# Patient Record
Sex: Female | Born: 1980 | Hispanic: Yes | Marital: Married | Smoking: Never smoker
Health system: Southern US, Community
[De-identification: ages and names within clinical notes are randomized; demographics above are authoritative.]

## PROBLEM LIST (undated history)

## (undated) DIAGNOSIS — O24419 Gestational diabetes mellitus in pregnancy, unspecified control: Secondary | ICD-10-CM

## (undated) DIAGNOSIS — D649 Anemia, unspecified: Secondary | ICD-10-CM

## (undated) HISTORY — PX: NO PAST SURGERIES: SHX2092

---

## 2007-10-09 ENCOUNTER — Ambulatory Visit (HOSPITAL_COMMUNITY): Admission: RE | Admit: 2007-10-09 | Discharge: 2007-10-09 | Payer: Self-pay | Admitting: Family Medicine

## 2007-10-19 ENCOUNTER — Ambulatory Visit: Payer: Self-pay | Admitting: Family Medicine

## 2007-10-19 ENCOUNTER — Encounter: Admission: RE | Admit: 2007-10-19 | Discharge: 2007-12-22 | Payer: Self-pay | Admitting: Family Medicine

## 2007-10-26 ENCOUNTER — Ambulatory Visit: Payer: Self-pay | Admitting: *Deleted

## 2007-10-28 ENCOUNTER — Ambulatory Visit: Payer: Self-pay | Admitting: Family Medicine

## 2007-11-02 ENCOUNTER — Ambulatory Visit: Payer: Self-pay | Admitting: Obstetrics & Gynecology

## 2007-11-09 ENCOUNTER — Ambulatory Visit: Payer: Self-pay | Admitting: Obstetrics & Gynecology

## 2007-11-16 ENCOUNTER — Ambulatory Visit: Payer: Self-pay | Admitting: Obstetrics & Gynecology

## 2007-11-16 ENCOUNTER — Ambulatory Visit (HOSPITAL_COMMUNITY): Admission: RE | Admit: 2007-11-16 | Discharge: 2007-11-16 | Payer: Self-pay | Admitting: Family Medicine

## 2007-11-23 ENCOUNTER — Ambulatory Visit: Payer: Self-pay | Admitting: Obstetrics & Gynecology

## 2007-12-14 ENCOUNTER — Ambulatory Visit: Payer: Self-pay | Admitting: Obstetrics & Gynecology

## 2007-12-21 ENCOUNTER — Ambulatory Visit: Payer: Self-pay | Admitting: Obstetrics & Gynecology

## 2007-12-28 ENCOUNTER — Ambulatory Visit: Payer: Self-pay | Admitting: Obstetrics & Gynecology

## 2007-12-28 ENCOUNTER — Ambulatory Visit (HOSPITAL_COMMUNITY): Admission: RE | Admit: 2007-12-28 | Discharge: 2007-12-28 | Payer: Self-pay | Admitting: Gynecology

## 2007-12-31 ENCOUNTER — Ambulatory Visit: Payer: Self-pay | Admitting: Obstetrics & Gynecology

## 2008-01-04 ENCOUNTER — Ambulatory Visit: Payer: Self-pay | Admitting: Obstetrics & Gynecology

## 2008-01-07 ENCOUNTER — Ambulatory Visit: Payer: Self-pay | Admitting: Obstetrics & Gynecology

## 2008-01-11 ENCOUNTER — Ambulatory Visit: Payer: Self-pay | Admitting: Family Medicine

## 2008-01-14 ENCOUNTER — Ambulatory Visit: Payer: Self-pay | Admitting: Obstetrics & Gynecology

## 2008-01-18 ENCOUNTER — Encounter: Admission: RE | Admit: 2008-01-18 | Discharge: 2008-01-25 | Payer: Self-pay | Admitting: Family Medicine

## 2008-01-18 ENCOUNTER — Ambulatory Visit (HOSPITAL_COMMUNITY): Admission: RE | Admit: 2008-01-18 | Discharge: 2008-01-18 | Payer: Self-pay | Admitting: Family Medicine

## 2008-01-18 ENCOUNTER — Ambulatory Visit: Payer: Self-pay | Admitting: Obstetrics & Gynecology

## 2008-01-21 ENCOUNTER — Ambulatory Visit: Payer: Self-pay | Admitting: Obstetrics & Gynecology

## 2008-01-25 ENCOUNTER — Ambulatory Visit: Payer: Self-pay | Admitting: Obstetrics & Gynecology

## 2008-01-25 ENCOUNTER — Encounter: Payer: Self-pay | Admitting: Family Medicine

## 2008-01-28 ENCOUNTER — Ambulatory Visit: Payer: Self-pay | Admitting: Family Medicine

## 2008-02-01 ENCOUNTER — Ambulatory Visit: Payer: Self-pay | Admitting: Obstetrics & Gynecology

## 2008-02-01 ENCOUNTER — Encounter: Payer: Self-pay | Admitting: Family Medicine

## 2008-02-01 ENCOUNTER — Ambulatory Visit (HOSPITAL_COMMUNITY): Admission: RE | Admit: 2008-02-01 | Discharge: 2008-02-01 | Payer: Self-pay | Admitting: Obstetrics & Gynecology

## 2008-02-01 LAB — CONVERTED CEMR LAB
Chlamydia, DNA Probe: NEGATIVE
GC Probe Amp, Genital: NEGATIVE

## 2008-02-04 ENCOUNTER — Ambulatory Visit: Payer: Self-pay | Admitting: Family Medicine

## 2008-02-08 ENCOUNTER — Ambulatory Visit: Payer: Self-pay | Admitting: Obstetrics & Gynecology

## 2008-02-08 ENCOUNTER — Ambulatory Visit (HOSPITAL_COMMUNITY): Admission: RE | Admit: 2008-02-08 | Discharge: 2008-02-08 | Payer: Self-pay | Admitting: Obstetrics & Gynecology

## 2008-02-11 ENCOUNTER — Ambulatory Visit: Payer: Self-pay | Admitting: Obstetrics & Gynecology

## 2008-02-14 ENCOUNTER — Ambulatory Visit: Payer: Self-pay | Admitting: Obstetrics and Gynecology

## 2008-02-14 ENCOUNTER — Inpatient Hospital Stay (HOSPITAL_COMMUNITY): Admission: AD | Admit: 2008-02-14 | Discharge: 2008-02-16 | Payer: Self-pay | Admitting: Obstetrics & Gynecology

## 2010-04-01 NOTE — L&D Delivery Note (Signed)
Delivery Note Arrived in MAU in active labor with urge to push.  Cervix complete/+3.  At 1:53 AM a viable and healthy female was delivered via Vaginal, Spontaneous Delivery (Presentation: ;  ).  APGAR: 8, 9; weight .   Placenta status: Intact, Spontaneous.  Cord: 3 vessels with the following complications: None.  Cord pH: n/a  No difficulty with shoulders.  Thick MSAF noted on delivery.   Anesthesia: None  Episiotomy: None Lacerations: None Suture Repair: n/a Est. Blood Loss (mL): 200  Mother had one episode of syncope when standing momentarily at bedside. Improved with supine position change.   Mom to postpartum.  Baby to nursery-stable.  Wynelle Bourgeois 02/12/2011, 2:29 AM

## 2010-11-12 ENCOUNTER — Other Ambulatory Visit: Payer: Self-pay | Admitting: Family Medicine

## 2010-11-12 DIAGNOSIS — Z3689 Encounter for other specified antenatal screening: Secondary | ICD-10-CM

## 2010-11-19 ENCOUNTER — Ambulatory Visit (HOSPITAL_COMMUNITY)
Admission: RE | Admit: 2010-11-19 | Discharge: 2010-11-19 | Disposition: A | Discharge status: Home / Self Care | Payer: Self-pay | Source: Ambulatory Visit | Attending: Family Medicine | Admitting: Family Medicine

## 2010-11-19 DIAGNOSIS — Z363 Encounter for antenatal screening for malformations: Secondary | ICD-10-CM | POA: Insufficient documentation

## 2010-11-19 DIAGNOSIS — O358XX0 Maternal care for other (suspected) fetal abnormality and damage, not applicable or unspecified: Secondary | ICD-10-CM | POA: Insufficient documentation

## 2010-11-19 DIAGNOSIS — Z1389 Encounter for screening for other disorder: Secondary | ICD-10-CM | POA: Insufficient documentation

## 2010-11-19 DIAGNOSIS — Z3689 Encounter for other specified antenatal screening: Secondary | ICD-10-CM

## 2010-12-28 LAB — POCT URINALYSIS DIP (DEVICE)
Bilirubin Urine: NEGATIVE
Bilirubin Urine: NEGATIVE
Hgb urine dipstick: NEGATIVE
Hgb urine dipstick: NEGATIVE
Ketones, ur: NEGATIVE
Nitrite: NEGATIVE
Operator id: 194561
Operator id: 297281
Protein, ur: NEGATIVE
Protein, ur: NEGATIVE
Protein, ur: NEGATIVE
Specific Gravity, Urine: 1.02
Urobilinogen, UA: 0.2
Urobilinogen, UA: 0.2
pH: 6
pH: 6.5
pH: 6.5

## 2010-12-28 LAB — GLUCOSE, CAPILLARY: Glucose-Capillary: 106 — ABNORMAL HIGH

## 2010-12-31 LAB — POCT URINALYSIS DIP (DEVICE)
Hgb urine dipstick: NEGATIVE
Nitrite: NEGATIVE
Protein, ur: NEGATIVE
Protein, ur: NEGATIVE
Specific Gravity, Urine: 1.015
Specific Gravity, Urine: 1.015
Urobilinogen, UA: 0.2
Urobilinogen, UA: 0.2
pH: 7

## 2011-01-01 LAB — POCT URINALYSIS DIP (DEVICE)
Bilirubin Urine: NEGATIVE
Bilirubin Urine: NEGATIVE
Bilirubin Urine: NEGATIVE
Bilirubin Urine: NEGATIVE
Glucose, UA: NEGATIVE
Glucose, UA: NEGATIVE
Glucose, UA: NEGATIVE
Hgb urine dipstick: NEGATIVE
Hgb urine dipstick: NEGATIVE
Hgb urine dipstick: NEGATIVE
Ketones, ur: NEGATIVE
Ketones, ur: NEGATIVE
Nitrite: NEGATIVE
Nitrite: NEGATIVE
Nitrite: NEGATIVE
Operator id: 148111
Operator id: 297281
Operator id: 287931
Protein, ur: 30 — AB
Protein, ur: NEGATIVE
Protein, ur: NEGATIVE
Protein, ur: NEGATIVE
Specific Gravity, Urine: 1.01
Specific Gravity, Urine: 1.015
Specific Gravity, Urine: 1.015
Urobilinogen, UA: 0.2
Urobilinogen, UA: 0.2
pH: 5.5
pH: 6

## 2011-01-01 LAB — CBC
Hemoglobin: 9.7 — ABNORMAL LOW
MCHC: 34
RBC: 3.28 — ABNORMAL LOW
RDW: 15.9 — ABNORMAL HIGH
RDW: 16.5 — ABNORMAL HIGH
WBC: 14.3 — ABNORMAL HIGH

## 2011-01-01 LAB — GLUCOSE, CAPILLARY
Glucose-Capillary: 75
Glucose-Capillary: 81

## 2011-01-02 LAB — POCT URINALYSIS DIP (DEVICE)
Bilirubin Urine: NEGATIVE
Glucose, UA: NEGATIVE
Hgb urine dipstick: NEGATIVE
Protein, ur: 100 — AB
Specific Gravity, Urine: 1.02
Urobilinogen, UA: 0.2

## 2011-02-12 ENCOUNTER — Inpatient Hospital Stay (HOSPITAL_COMMUNITY)
Admission: AD | Admit: 2011-02-12 | Discharge: 2011-02-14 | DRG: 774 | Disposition: A | Discharge status: Home / Self Care | Payer: Medicaid Other | Source: Ambulatory Visit | Attending: Obstetrics & Gynecology | Admitting: Obstetrics & Gynecology

## 2011-02-12 ENCOUNTER — Encounter (HOSPITAL_COMMUNITY): Payer: Self-pay | Admitting: *Deleted

## 2011-02-12 DIAGNOSIS — Z8632 Personal history of gestational diabetes: Secondary | ICD-10-CM

## 2011-02-12 DIAGNOSIS — O9903 Anemia complicating the puerperium: Secondary | ICD-10-CM

## 2011-02-12 DIAGNOSIS — D649 Anemia, unspecified: Secondary | ICD-10-CM | POA: Diagnosis not present

## 2011-02-12 DIAGNOSIS — O2650 Maternal hypotension syndrome, unspecified trimester: Secondary | ICD-10-CM

## 2011-02-12 DIAGNOSIS — O0930 Supervision of pregnancy with insufficient antenatal care, unspecified trimester: Secondary | ICD-10-CM

## 2011-02-12 DIAGNOSIS — Z3480 Encounter for supervision of other normal pregnancy, unspecified trimester: Secondary | ICD-10-CM

## 2011-02-12 LAB — CBC
MCH: 30.7 pg (ref 26.0–34.0)
MCHC: 33.6 g/dL (ref 30.0–36.0)
Platelets: 163 10*3/uL (ref 150–400)
RBC: 2.41 MIL/uL — ABNORMAL LOW (ref 3.87–5.11)
RDW: 15 % (ref 11.5–15.5)

## 2011-02-12 MED ORDER — DIBUCAINE 1 % RE OINT: 1.0000 "application " | TOPICAL_OINTMENT | RECTAL | Status: DC | PRN

## 2011-02-12 MED ORDER — ONDANSETRON HCL 4 MG PO TABS: 4.0000 mg | ORAL_TABLET | ORAL | Status: DC | PRN

## 2011-02-12 MED ORDER — TETANUS-DIPHTH-ACELL PERTUSSIS 5-2.5-18.5 LF-MCG/0.5 IM SUSP
0.5000 mL | Freq: Once | INTRAMUSCULAR | Status: AC
  Administered 2011-02-13: 0.5 mL via INTRAMUSCULAR
  Filled 2011-02-12: qty 0.5

## 2011-02-12 MED ORDER — LACTATED RINGERS IV SOLN
INTRAVENOUS | Status: DC
  Administered 2011-02-12 (×2): via INTRAVENOUS

## 2011-02-12 MED ORDER — SIMETHICONE 80 MG PO CHEW: 80.0000 mg | CHEWABLE_TABLET | ORAL | Status: DC | PRN

## 2011-02-12 MED ORDER — OXYTOCIN 20 UNITS IN LACTATED RINGERS INFUSION - SIMPLE
INTRAVENOUS | Status: AC
  Filled 2011-02-12: qty 1000

## 2011-02-12 MED ORDER — PRENATAL PLUS 27-1 MG PO TABS
1.0000 | ORAL_TABLET | Freq: Every day | ORAL | Status: DC
  Administered 2011-02-12 – 2011-02-14 (×3): 1 via ORAL
  Filled 2011-02-12 (×4): qty 1

## 2011-02-12 MED ORDER — ZOLPIDEM TARTRATE 5 MG PO TABS: 5.0000 mg | ORAL_TABLET | Freq: Every evening | ORAL | Status: DC | PRN

## 2011-02-12 MED ORDER — LACTATED RINGERS IV SOLN
Freq: Once | INTRAVENOUS | Status: AC
  Administered 2011-02-12: 17:00:00 via INTRAVENOUS

## 2011-02-12 MED ORDER — ACETAMINOPHEN 325 MG PO TABS: 650.0000 mg | ORAL_TABLET | Freq: Four times a day (QID) | ORAL | Status: DC | PRN

## 2011-02-12 MED ORDER — SENNOSIDES-DOCUSATE SODIUM 8.6-50 MG PO TABS
2.0000 | ORAL_TABLET | Freq: Every day | ORAL | Status: DC
  Administered 2011-02-12 – 2011-02-13 (×2): 2 via ORAL

## 2011-02-12 MED ORDER — LANOLIN HYDROUS EX OINT: TOPICAL_OINTMENT | CUTANEOUS | Status: DC | PRN

## 2011-02-12 MED ORDER — DIPHENHYDRAMINE HCL 25 MG PO CAPS: 25.0000 mg | ORAL_CAPSULE | Freq: Four times a day (QID) | ORAL | Status: DC | PRN

## 2011-02-12 MED ORDER — BENZOCAINE-MENTHOL 20-0.5 % EX AERO: 1.0000 "application " | INHALATION_SPRAY | CUTANEOUS | Status: DC | PRN

## 2011-02-12 MED ORDER — IBUPROFEN 600 MG PO TABS
600.0000 mg | ORAL_TABLET | Freq: Four times a day (QID) | ORAL | Status: DC
  Administered 2011-02-12 – 2011-02-14 (×9): 600 mg via ORAL
  Filled 2011-02-12 (×9): qty 1

## 2011-02-12 MED ORDER — LACTATED RINGERS IV SOLN: Freq: Once | INTRAVENOUS | Status: DC

## 2011-02-12 MED ORDER — OXYCODONE-ACETAMINOPHEN 5-325 MG PO TABS
1.0000 | ORAL_TABLET | ORAL | Status: DC | PRN
  Administered 2011-02-12 – 2011-02-13 (×2): 1 via ORAL
  Filled 2011-02-12 (×2): qty 1

## 2011-02-12 MED ORDER — WITCH HAZEL-GLYCERIN EX PADS: 1.0000 "application " | MEDICATED_PAD | CUTANEOUS | Status: DC | PRN

## 2011-02-12 MED ORDER — ONDANSETRON HCL 4 MG/2ML IJ SOLN: 4.0000 mg | INTRAMUSCULAR | Status: DC | PRN

## 2011-02-12 NOTE — Progress Notes (Signed)
UR Chart review completed.  

## 2011-02-12 NOTE — Progress Notes (Signed)
TO RM 319-  IN BED . PT ALERT AND ORIENT- COLOR.  DID NOT GET UP TO B-ROOM. GAVE REPORT TO LISA, RN

## 2011-02-12 NOTE — Progress Notes (Signed)
PT IN LOBBY - BROUGHT TO RM 3- VIA W/C.- WITH URGE TO PUSH.     VAG DEL  BABY  BOY- AT 0153.- MEC STAIN.

## 2011-02-12 NOTE — Progress Notes (Signed)
[facility] Department of Clinical Social Work Documentation of Interpretation   I assisted ____Faculty Practice_______________ with interpretation of ____plan of care__________________ for this patient. 

## 2011-02-12 NOTE — Progress Notes (Signed)
Subjective: I was called Jean Mays bedside to evaluate her for dizziness and orthostatic hypotension. The patient endorses dizziness that started at 2:00 AM. It is worse when sitting, and she cannot stand. It is improving slightly.   Objective: General: alert, oriented, pale, lying supine in bed Cardiovascular: tachycardia, regular rhythm, no murmur, 2+ radial pulses, 1+ dorsalis pedis pulses bilaterally; pallor of palms of hands Eyes: conjunctival pallor bilat;  Vaginal Exam: no clots or active bleeding evident, uterus firm  Orthostatic vital signs: Lying:133/74, 94 Seated: 90/66, 122  A/P: Patient likely dehydrated secondary to birth with possible anemia. 1. Fluid bolus - 1L 2. Check CBC 3. Tylenol 650 mg for headache 4. Reassess this afternoon

## 2011-02-12 NOTE — H&P (Signed)
Jean Mays is a 30 y.o. female presenting for labor  Maternal Medical History:  Reason for admission: Reason for admission: contractions.  Contractions: Onset was less than 1 hour ago.   Frequency: regular.   Perceived severity is strong.    Fetal activity: Perceived fetal activity is normal.    Prenatal complications: No bleeding.     OB History    Grav Para Term Preterm Abortions TAB SAB Ect Mult Living   1 1 1       1      No past medical history on file. No past surgical history on file. Family History: family history is not on file. Social History:  does not have a smoking history on file. She does not have any smokeless tobacco history on file. Her alcohol and drug histories not on file.  Review of Systems  Constitutional: Negative for fever.  Gastrointestinal: Positive for abdominal pain (contractions).      Blood pressure 127/76, pulse 59, resp. rate 20, unknown if currently breastfeeding. Maternal Exam:  Uterine Assessment: Contraction strength is firm.  Contraction frequency is regular.   Introitus: Normal vulva. Vagina is positive for vaginal discharge.  Ferning test: not done.  Nitrazine test: not done. Amniotic fluid character: meconium stained.  Pelvis: adequate for delivery.   Cervix: Cervix evaluated by digital exam.     Physical Exam  Constitutional: She is oriented to person, place, and time. She appears well-developed and well-nourished. She appears distressed (in labor, feeling pressure to push).  HENT:  Head: Normocephalic.  Cardiovascular: Normal rate.   Respiratory: Effort normal.  GI: Soft. There is no tenderness.       Gravid in contour, frequent contractions,  Genitourinary: Uterus normal. Vaginal discharge found.       Cervix 10/100/+3/vtx/ thick meconium stained amniotic fluid  Musculoskeletal: Normal range of motion.  Neurological: She is alert and oriented to person, place, and time.  Skin: Skin is warm and dry.    Psychiatric: She has a normal mood and affect.    Prenatal labs: ABO, Rh:   Antibody:   Rubella:   RPR:    HBsAg:    HIV:    GBS:     Assessment/Plan IUP at [redacted]w[redacted]d Active Labor, Second Stage Plan Delivery of baby in MAU  [facility] 02/12/2011, 2:23 AM

## 2011-02-13 LAB — CBC
HCT: 20.7 % — ABNORMAL LOW (ref 36.0–46.0)
MCHC: 32.4 g/dL (ref 30.0–36.0)
Platelets: 183 10*3/uL (ref 150–400)
RDW: 15.8 % — ABNORMAL HIGH (ref 11.5–15.5)
WBC: 10 10*3/uL (ref 4.0–10.5)

## 2011-02-13 MED ORDER — FERROUS SULFATE 325 (65 FE) MG PO TABS
325.0000 mg | ORAL_TABLET | Freq: Two times a day (BID) | ORAL | Status: DC
  Administered 2011-02-13 – 2011-02-14 (×3): 325 mg via ORAL
  Filled 2011-02-13 (×3): qty 1

## 2011-02-13 NOTE — Progress Notes (Signed)
Post Partum Day 1 Subjective: up ad lib, voiding, tolerating PO and eating and drinking well  Denies any dizziness and is having no trouble walking. Is having some cramping pain but this is well controlled with ibuprofen.  Objective: Blood pressure 106/71, pulse 86, temperature 98 F (36.7 C), temperature source Oral, resp. rate 18, SpO2 95.00%, unknown if currently breastfeeding.  Physical Exam:  General: alert, cooperative and no distress Lochia: appropriate Uterine Fundus: firm Incision: n/a DVT Evaluation: No evidence of DVT seen on physical exam. No cords or calf tenderness.   Basename 02/13/11 0555 02/12/11 1055  HGB 6.7* 7.4*  HCT 20.7* 22.0*    Assessment/Plan: Breastfeeding and Contraception would like to use OCPs.  Overall is feeling well despite drop in Hg, will start on oral Iron replacement today but no plans for transfusion as is not symptomatic, has stool softener prn. Will plan for d/c tomorrow as infant is in the NICU   LOS: 1 day   Drucie Ip 02/13/2011, 7:39 AM

## 2011-02-14 MED ORDER — FERROUS SULFATE 325 (65 FE) MG PO TABS: 325.0000 mg | ORAL_TABLET | Freq: Two times a day (BID) | ORAL | Status: DC

## 2011-02-14 MED ORDER — NORETHINDRONE 0.35 MG PO TABS: 1.0000 | ORAL_TABLET | Freq: Every day | ORAL | Status: DC

## 2011-02-14 MED ORDER — SENNOSIDES-DOCUSATE SODIUM 8.6-50 MG PO TABS: 1.0000 | ORAL_TABLET | Freq: Every day | ORAL | Status: AC

## 2011-02-14 NOTE — Discharge Summary (Signed)
Agree with above note.  Jean Mays H. 02/14/2011 12:57 PM

## 2011-02-14 NOTE — Progress Notes (Signed)
CLINICAL SOCIAL WORK  BRIEF PSYCHOSOCIAL ASSESSMENT  Referred by: NICU     On: 02/13/11    For: NICU support      _X_Patient Interview Family Interview  Other:   PSYCHOSOCIAL DATA:   Lives Alone  Lives with: baby to be discharged to parents' home.  Primary Support (Name/Relationship): Jean Mays/[name] Degree of support available:   CURRENT CONCERNS:     _X_None noted Substance Abuse     Behavioral Health Issues    Financial Resources     Abuse/Neglect/Domestic Violence   Cultural/Religious Issues     Post-Acute Placement    Adjustment to Illness     Knowledge/Cognitive Deficit      Other:     SOCIAL WORK ASSESSMENT/PLAN:  SW met with [name] at bedside to introduce myself, complete assessment and evaluate how she is coping with baby's admission to NICU.  SW communicated with [name] with the assistance of a Spanish interpreter.  SW explained support services offered by NICU SWs and asked [name] to let a nurse know if she ever needs to speak with the SW.    No Further Intervention Required  _X_Psychosocial Support/Ongoing Assessment of Needs Information/Referral to [facility] Other  PATIENT'S/FAMILY'S RESPONSE TO PLAN OF CARE:  [name] was very tearful and SW initially asked if she would rather talk at a later time.  [name] said SW could stay.  She states she is feeling well, but worried about baby.  SW validated feelings and discussed emotions related to NICU admission.  She states [name] is involved and supportive and that they have everything they need for baby at home.  This is [name]'s third baby.  She has a 23 and 36 year old at home.  She states no questions or needs at this time.

## 2011-02-14 NOTE — H&P (Signed)
Agree with above note.  LEGGETT,KELLY H. 02/14/2011 12:46 PM

## 2011-02-14 NOTE — Discharge Summary (Signed)
Obstetric Discharge Summary Reason for Admission: onset of labor Prenatal Procedures: ultrasound Intrapartum Procedures: spontaneous vaginal delivery Postpartum Procedures: none Complications-Operative and Postpartum: hemorrhage Hemoglobin  Date Value Range Status  02/13/2011 6.7* 12.0-15.0 (g/dL) Final     REPEATED TO VERIFY     CRITICAL RESULT CALLED TO, READ BACK BY AND VERIFIED WITH:     SCOTT, L. @ 0650 ON 02/13/11 BY BOVELL. A     HCT  Date Value Range Status  02/13/2011 20.7* 36.0-46.0 (%) Final   Hospital Course: Jean Mays is a 30 year old who arrived at a [id number] at term and completely dilated. She had a precipitous SVD in the MAU. Thereafter, she had a delayed postpartum hemorrhage of approximately 1000 mL that was accompanied by orthostatic hypotension. She was not actively bleeding and the uterus was firm when being evaluated for the the hemorrhage. Therefore, it was treated with a fluid bolus. Her Hb baseline was 11.1 and dropped to 7.4 and the 6.7. However, after the fluid bolus and increased PO intake, the patient was no longer symptomatic. Therefore, she was not transfused. She is stable and appropriate for d/c on postpartum day 2. She will be discharged on FeSO4 and Progestin contraception.   Discharge Diagnoses: Term Pregnancy-delivered and Post Partum Hemorrhage   Discharge Information: Date: 02/14/2011 Activity: pelvic rest Diet: routine Medications: Iron and Norethidrone 0.35 mg Condition: stable Instructions: refer to practice specific booklet Discharge to: home   Newborn Data: Live born female  Birth Weight: 6 lb 0.3 oz (2730 g) APGAR: 8, 9  Home with mother.  Jean Mays, Jean Mays 02/14/2011, 9:21 AM

## 2011-02-14 NOTE — Progress Notes (Signed)
[facility] Department of Clinical Social Work Documentation of Interpretation   I assisted ___Tanya RN________________ with interpretation of ___discharge instructions___________________ for this patient.

## 2011-02-14 NOTE — Progress Notes (Signed)
Post Partum Day 2 for precipitous NSVD, with post-partum hemorrhage   Subjective: no complaints, up ad lib, voiding and tolerating PO, no dizziness, no fainting, denies weakness  - infant still in NICU with bilirubin and glucose issues   Objective: Temp:  [97.9 F (36.6 C)-98.6 F (37 C)] 98.6 F (37 C) (11/15 0541) Pulse Rate:  [85-111] 85  (11/15 0541) Resp:  [18-20] 18  (11/15 0541) BP: (96-111)/(57-77) 103/59 mmHg (11/15 0541) SpO2:  [98 %-100 %] 98 % (11/15 0541)  Physical Exam:  General: alert, cooperative, appears stated age, no distress and pale Lochia: appropriate Uterine Fundus: firm DVT Evaluation: No evidence of DVT seen on physical exam. Cardiac: regular rate & rhythm, no mumur Lungs: clear to auscultation bilaterally    Basename 02/13/11 0555 02/12/11 1055  HGB 6.7* 7.4*  HCT 20.7* 22.0*    Assessment/Plan: Discharge home and Breastfeeding,  Anemia: continue FeSO4 Contraception: Progesterone only pills Follow-up: HD in 6 weeks, earlier in WOC if necessary    LOS: 2 days   Mat Carne 02/14/2011, 7:40 AM

## 2011-02-18 ENCOUNTER — Encounter (HOSPITAL_COMMUNITY): Payer: Self-pay | Admitting: *Deleted

## 2011-02-18 ENCOUNTER — Inpatient Hospital Stay (HOSPITAL_COMMUNITY)
Admission: AD | Admit: 2011-02-18 | Discharge: 2011-02-19 | DRG: 776 | Disposition: A | Discharge status: Home / Self Care | Payer: Self-pay | Source: Ambulatory Visit | Attending: Obstetrics & Gynecology | Admitting: Obstetrics & Gynecology

## 2011-02-18 DIAGNOSIS — O9081 Anemia of the puerperium: Principal | ICD-10-CM | POA: Diagnosis present

## 2011-02-18 DIAGNOSIS — D649 Anemia, unspecified: Secondary | ICD-10-CM | POA: Diagnosis present

## 2011-02-18 DIAGNOSIS — Z8632 Personal history of gestational diabetes: Secondary | ICD-10-CM

## 2011-02-18 DIAGNOSIS — Z3480 Encounter for supervision of other normal pregnancy, unspecified trimester: Secondary | ICD-10-CM

## 2011-02-18 DIAGNOSIS — O0930 Supervision of pregnancy with insufficient antenatal care, unspecified trimester: Secondary | ICD-10-CM

## 2011-02-18 HISTORY — DX: Anemia, unspecified: D64.9

## 2011-02-18 HISTORY — DX: Gestational diabetes mellitus in pregnancy, unspecified control: O24.419

## 2011-02-18 LAB — CBC
Hemoglobin: 6.9 g/dL — CL (ref 12.0–15.0)
Platelets: 342 10*3/uL (ref 150–400)
RBC: 2.32 MIL/uL — ABNORMAL LOW (ref 3.87–5.11)
WBC: 9 10*3/uL (ref 4.0–10.5)

## 2011-02-18 LAB — PREPARE RBC (CROSSMATCH)

## 2011-02-18 LAB — ABO/RH: ABO/RH(D): O POS

## 2011-02-18 MED ORDER — SODIUM CHLORIDE 0.9 % IV SOLN
INTRAVENOUS | Status: DC
  Administered 2011-02-18: 22:00:00 via INTRAVENOUS

## 2011-02-18 MED ORDER — PRENATAL PLUS 27-1 MG PO TABS
1.0000 | ORAL_TABLET | Freq: Every day | ORAL | Status: DC
  Administered 2011-02-19: 1 via ORAL
  Filled 2011-02-18: qty 1

## 2011-02-18 MED ORDER — DOCUSATE SODIUM 100 MG PO CAPS
100.0000 mg | ORAL_CAPSULE | Freq: Every day | ORAL | Status: DC
  Administered 2011-02-19: 100 mg via ORAL
  Filled 2011-02-18: qty 1

## 2011-02-18 MED ORDER — DOCUSATE SODIUM 100 MG PO CAPS: 100.0000 mg | ORAL_CAPSULE | Freq: Every day | ORAL | Status: DC

## 2011-02-18 MED ORDER — OXYCODONE-ACETAMINOPHEN 5-325 MG PO TABS: 1.0000 | ORAL_TABLET | ORAL | Status: DC | PRN

## 2011-02-18 MED ORDER — FERROUS SULFATE 325 (65 FE) MG PO TABS: 325.0000 mg | ORAL_TABLET | Freq: Three times a day (TID) | ORAL | Status: DC

## 2011-02-18 MED ORDER — ZOLPIDEM TARTRATE 5 MG PO TABS: 5.0000 mg | ORAL_TABLET | Freq: Every evening | ORAL | Status: DC | PRN

## 2011-02-18 MED ORDER — DIPHENHYDRAMINE HCL 25 MG PO CAPS: 25.0000 mg | ORAL_CAPSULE | Freq: Once | ORAL | Status: DC

## 2011-02-18 MED ORDER — PRENATAL PLUS 27-1 MG PO TABS: 1.0000 | ORAL_TABLET | Freq: Every day | ORAL | Status: DC

## 2011-02-18 MED ORDER — ACETAMINOPHEN 325 MG PO TABS
650.0000 mg | ORAL_TABLET | Freq: Once | ORAL | Status: AC
  Administered 2011-02-19: 650 mg via ORAL
  Filled 2011-02-18: qty 2

## 2011-02-18 MED ORDER — IBUPROFEN 600 MG PO TABS: 600.0000 mg | ORAL_TABLET | Freq: Four times a day (QID) | ORAL | Status: DC | PRN

## 2011-02-18 MED ORDER — ACETAMINOPHEN 325 MG PO TABS: 650.0000 mg | ORAL_TABLET | Freq: Four times a day (QID) | ORAL | Status: DC | PRN

## 2011-02-18 MED ORDER — SODIUM CHLORIDE 0.9 % IV SOLN: INTRAVENOUS | Status: DC

## 2011-02-18 MED ORDER — FERROUS SULFATE 325 (65 FE) MG PO TABS
325.0000 mg | ORAL_TABLET | Freq: Three times a day (TID) | ORAL | Status: DC
  Administered 2011-02-19: 325 mg via ORAL
  Filled 2011-02-18 (×2): qty 1

## 2011-02-18 NOTE — ED Provider Notes (Signed)
Jean Mays is a 30 y.o. female presenting 6 days post-partum with a complaint of headache.  History is obtained primarily through Jean Mays who states that her headache has been present since discharge.  Initially she was able to control her symptoms by taking tylenol 1000mg  BID however today it did not work in controlling her pain. She states that her headache is a 5/10 and present bilaterally.  She denies any vision or neurological changes associated with this headache. She also denies any dizziness but does state that she has been feeling fatigued.  She has been eating and drinking as normal and has been sleeping well. She denies any history of previous headaches.  Her delivery was complicated by a post-partum hemorrhage and she was discharged to home on Iron 325mg  BID which she has been taking.  Upon discharge her Hg was 6.7. She states her current vaginal bleeding is about the same as a period.  She has had some increased stress lately as her infant has been in the NICU but she states he is doing well and is preparing for discharge tomorrow. History OB History    Grav Para Term Preterm Abortions TAB SAB Ect Mult Living   4 3 3  0 1 0 1 0 0 3     Past Medical History  Diagnosis Date  . Gestational diabetes   . Anemia    Past Surgical History  Procedure Date  . No past surgeries    Family History: family history is not on file. Social History:  reports that she has never smoked. She does not have any smokeless tobacco history on file. She reports that she does not drink alcohol or use illicit drugs.  ROS As per HPI   Blood pressure 129/84, pulse 95, temperature 98.3 F (36.8 C), temperature source Oral, resp. rate 20, height 5\' 6"  (1.676 m), weight 87.261 kg (192 lb 6 oz), SpO2 99.00%, currently breastfeeding. Exam Physical Exam  Constitutional: She is oriented to person, place, and time. She appears well-developed and well-nourished.       Appears pale  HENT:  Head:  Normocephalic and atraumatic.  Eyes: Pupils are equal, round, and reactive to light.       Conjunctival pallor  Cardiovascular: Normal rate, regular rhythm, normal heart sounds and intact distal pulses.   Respiratory: Effort normal and breath sounds normal. She has no wheezes. She has no rales.  GI: Soft.  Neurological: She is alert and oriented to person, place, and time. She has normal reflexes. She displays normal reflexes. No cranial nerve deficit or sensory deficit. She exhibits normal muscle tone. Coordination normal.  Skin: Skin is warm and dry.  Psychiatric: She has a normal mood and affect. Her behavior is normal. Judgment and thought content normal.    Results for orders placed during the hospital encounter of 02/18/11 (from the past 24 hour(s))  CBC     Status: Abnormal   Collection Time   02/18/11  8:48 PM      Component Value Range   WBC 9.0  4.0 - 10.5 (K/uL)   RBC 2.32 (*) 3.87 - 5.11 (MIL/uL)   Hemoglobin 6.9 (*) 12.0 - 15.0 (g/dL)   HCT 21.3 (*) 08.6 - 46.0 (%)   MCV 96.1  78.0 - 100.0 (fL)   MCH 29.7  26.0 - 34.0 (pg)   MCHC 30.9  30.0 - 36.0 (g/dL)   RDW 57.8 (*) 46.9 - 15.5 (%)   Platelets 342  150 - 400 (K/uL)  Assessment/Plan: A:30 y.o. [id number] presenting six days post-partum with concern for headache P: 1. Headache: Given the nature of her headache in combination with a persistently low hemoglobin the most likely cause for her symptoms are anemia.  She was given the option to go home and continue on Iron therapy vs be admitted for a transfusion. After the risks and benefits of each were explained to her, she elected to stay overnight for transfusion.  Therefore we will plan to transfuse 2units, re-check her Hg 4 hours post-partum and if is improving tomorrow, will plan for discharge home.   2. Anemia: Please see plan stated above  Plan discussed with Dr. Marice Potter.  Drucie Ip 02/18/2011, 10:02 PM

## 2011-02-18 NOTE — Progress Notes (Signed)
Pt delivered on 02-12-11-a preceip delivery in MAU-pt had a post partum hemorrhage afterwards-she has been discharged-but the baby is in NICCU due to low blood sugars and she has been in the hopsital every day with the baby-today she has been feeling very bad and has a headache and feels she is anemic

## 2011-02-18 NOTE — Progress Notes (Signed)
Reports headaches since delivering baby svd on 02/12/11.  Pt had a postpartum hemorrhage but did not receive a blood transfusion.  Has taken tylenol for headaches, helps temporarily but then returns.  Reports small amount of vaginal bleeding.  Pt reports weakness but denies any dizziness.

## 2011-02-19 DIAGNOSIS — D649 Anemia, unspecified: Secondary | ICD-10-CM

## 2011-02-19 DIAGNOSIS — Z3480 Encounter for supervision of other normal pregnancy, unspecified trimester: Secondary | ICD-10-CM

## 2011-02-19 LAB — CBC
MCH: 29.7 pg (ref 26.0–34.0)
MCHC: 31.8 g/dL (ref 30.0–36.0)
Platelets: 310 10*3/uL (ref 150–400)
RBC: 2.76 MIL/uL — ABNORMAL LOW (ref 3.87–5.11)
RDW: 17.5 % — ABNORMAL HIGH (ref 11.5–15.5)

## 2011-02-19 MED ORDER — PRENATAL PLUS 27-1 MG PO TABS: 1.0000 | ORAL_TABLET | Freq: Every day | ORAL | Status: DC

## 2011-02-19 MED ORDER — FERROUS SULFATE 325 (65 FE) MG PO TABS: 325.0000 mg | ORAL_TABLET | Freq: Two times a day (BID) | ORAL | Status: DC

## 2011-02-19 MED ORDER — DIPHENHYDRAMINE HCL 25 MG PO CAPS
25.0000 mg | ORAL_CAPSULE | Freq: Four times a day (QID) | ORAL | Status: AC | PRN
  Administered 2011-02-19: 25 mg via ORAL
  Filled 2011-02-19: qty 1

## 2011-02-19 NOTE — Progress Notes (Signed)
[facility] Department of Clinical Social Work Documentation of Interpretation   I assisted __Tanya RN_________________ with interpretation of _questions_____________________ for this patient.

## 2011-02-19 NOTE — Discharge Summary (Signed)
Physician Discharge Summary  Patient ID: Jean Mays MRN: [id number] DOB/AGE: January 23, 1981 30 y.o.  Admit date: 02/18/2011 Discharge date: 02/19/2011  Admission Diagnoses: Symptomatic Anemia  Discharge Diagnoses:  Active Problems:  Anemia   Discharged Condition: good  Hospital Course: This is a 30 year old female who is 7 days postpartum who presents with vaginal bleeding and symptomatic anemia yesterday. Her hemoglobin was 6.7 upon admission. The patient received 2 units of packed red blood cells with a hemoglobin of 8.2 this morning. The patient is asymptomatic. I was sent home in stable condition. Her followup is in 5 weeks for postpartum check.  Consults: none  Significant Diagnostic Studies: labs: Hemoglobin 6.7 on admission and 8.2 status post 2 units of red blood cells  Treatments: Transfusion of blood cells Discharge Exam:  Blood pressure 110/72, pulse 69, temperature 98.3 F (36.8 C), temperature source Oral, resp. rate 18, height 5\' 6"  (1.676 m), weight 87.261 kg (192 lb 6 oz), SpO2 100.00%, currently breastfeeding. General appearance: alert, cooperative and no distress Head: Normocephalic, without obvious abnormality, atraumatic Resp: Normal respiratory rate.  Non-labored breathing. Cardio: regular rate GI: soft, non-tender; bowel sounds normal; no masses,  no organomegaly Pulses: 2+ and symmetric Skin: Skin color, texture, turgor normal. No rashes or lesions  Disposition: Home or Self Care   Current Discharge Medication List    CONTINUE these medications which have CHANGED   Details  ferrous sulfate 325 (65 FE) MG tablet Take 1 tablet (325 mg total) by mouth 2 (two) times daily. Qty: 30 tablet, Refills: 0    prenatal vitamin w/FE, FA (PRENATAL 1 + 1) 27-1 MG TABS Take 1 tablet by mouth daily. Qty: 30 each      CONTINUE these medications which have NOT CHANGED   Details  acetaminophen (TYLENOL) 325 MG tablet Take 650 mg by mouth every 6 (six)  hours as needed. For pain     senna-docusate (SENOKOT-S) 8.6-50 MG per tablet Take 1 tablet by mouth at bedtime. Qty: 31 tablet, Refills: 5    norethindrone (ORTHO MICRONOR) 0.35 MG tablet Take 1 tablet (0.35 mg total) by mouth daily. Qty: 1 Package, Refills: 11       Follow-up Information    Follow up with [facility] [place]. Make an appointment in 5 weeks. (for postpartum appointment)    Contact information:   [address] [facility] [place] [address]          Signed: Candelaria Celeste JEHIEL 02/19/2011, 10:37 AM

## 2011-02-20 LAB — TYPE AND SCREEN
ABO/RH(D): O POS
Antibody Screen: NEGATIVE
Unit division: 0

## 2013-10-07 LAB — GLUCOSE TOLERANCE, 1 HOUR (50G) W/O FASTING: GLUCOSE: 315

## 2013-10-07 LAB — OB RESULTS CONSOLE HGB/HCT, BLOOD
HCT: 37 %
HEMATOCRIT: 37 %
HEMOGLOBIN: 12.1 g/dL
HEMOGLOBIN: 12.1 g/dL

## 2013-10-07 LAB — OB RESULTS CONSOLE RUBELLA ANTIBODY, IGM: Rubella: IMMUNE

## 2013-10-07 LAB — OB RESULTS CONSOLE GC/CHLAMYDIA
Chlamydia: NEGATIVE
GC PROBE AMP, GENITAL: NEGATIVE

## 2013-10-07 LAB — OB RESULTS CONSOLE PLATELET COUNT
PLATELETS: 275 10*3/uL
PLATELETS: 275 10*3/uL

## 2013-10-07 LAB — OB RESULTS CONSOLE RPR: RPR: NONREACTIVE

## 2013-10-07 LAB — OB RESULTS CONSOLE ABO/RH: RH TYPE: POSITIVE

## 2013-10-07 LAB — OB RESULTS CONSOLE VARICELLA ZOSTER ANTIBODY, IGG: VARICELLA IGG: IMMUNE

## 2013-10-07 LAB — CYSTIC FIBROSIS DIAGNOSTIC STUDY: Interpretation-CFDNA:: NEGATIVE

## 2013-10-07 LAB — SICKLE CELL SCREEN: SICKLE CELL SCREEN: NORMAL

## 2013-10-07 LAB — GLUCOSE TOLERANCE, 1 HOUR: Glucose, GTT - 1 Hour: 315 mg/dL — AB (ref ?–200)

## 2013-10-07 LAB — OB RESULTS CONSOLE HEPATITIS B SURFACE ANTIGEN: Hepatitis B Surface Ag: NEGATIVE

## 2013-10-07 LAB — OB RESULTS CONSOLE HIV ANTIBODY (ROUTINE TESTING): HIV: NONREACTIVE

## 2013-10-07 LAB — OB RESULTS CONSOLE ANTIBODY SCREEN: Antibody Screen: NEGATIVE

## 2013-10-07 LAB — CULTURE, OB URINE: URINE CULTURE, OB: NEGATIVE

## 2013-10-11 ENCOUNTER — Ambulatory Visit (INDEPENDENT_AMBULATORY_CARE_PROVIDER_SITE_OTHER): Payer: Medicaid Other | Admitting: Obstetrics & Gynecology

## 2013-10-11 ENCOUNTER — Encounter: Payer: Medicaid Other | Attending: Obstetrics & Gynecology | Admitting: *Deleted

## 2013-10-11 DIAGNOSIS — E119 Type 2 diabetes mellitus without complications: Secondary | ICD-10-CM | POA: Insufficient documentation

## 2013-10-11 DIAGNOSIS — O24419 Gestational diabetes mellitus in pregnancy, unspecified control: Secondary | ICD-10-CM

## 2013-10-11 DIAGNOSIS — O24112 Pre-existing diabetes mellitus, type 2, in pregnancy, second trimester: Secondary | ICD-10-CM

## 2013-10-11 DIAGNOSIS — O24919 Unspecified diabetes mellitus in pregnancy, unspecified trimester: Secondary | ICD-10-CM | POA: Diagnosis present

## 2013-10-11 DIAGNOSIS — Z713 Dietary counseling and surveillance: Secondary | ICD-10-CM | POA: Diagnosis not present

## 2013-10-11 DIAGNOSIS — O99810 Abnormal glucose complicating pregnancy: Secondary | ICD-10-CM

## 2013-10-11 MED ORDER — GLYBURIDE 5 MG PO TABS: 5.0000 mg | ORAL_TABLET | Freq: Two times a day (BID) | ORAL | Status: DC

## 2013-10-11 NOTE — Progress Notes (Signed)
Jean Mays seen today for initial OB visit nutrition consult to review diabetic diet.  Pt diagnosed with type 2 DM, not GDM.  Reported pregravid wt 188# and measured ht 66"- BMI > 30 pregravid.  4.4# gain at 14w is within normal limits.  She reports eating 3 meals/day plus snacks some days.  Taking PNV and glyburide.  Discussed wt gain goal of 11-20# and incorporating physical activity in daily routine. Pt given verbal and written diabetic diet education. She agrees to follow diabetic diet including 3 meals + 3 snacks/day and proper carbohydrate/protein combination.  [facility]  Follow up in 2-4 weeks.  Melanee LeftErin Cashwell, MPH RD LDN

## 2013-10-11 NOTE — Progress Notes (Signed)
DIABETES: Interpreter Present... Patient is transfer from [facility]. She has T2DM without treatment for a few years. Health dept. Started Metformin 500Mg BID. I have consulted with Dr. Roselie Awkward and we have changed to Glyburide 9m BID. She states she is familiar with DM diet. Need to f/u with nutrition consult. She is to call on Friday to glucose readings for evaluation.  Patient was seen on 10/11/13 for Gestational Diabetes self-management . The following learning objectives were met:   States the definition of Gestational Diabetes  Demonstrates carbohydrate counting   States when to check blood glucose levels  Demonstrates proper blood glucose monitoring techniques  States the effect of stress and exercise on blood glucose levels  Plan:  Consider  increasing your activity level by walking daily as tolerated Begin checking BG before breakfast and 2 hours after first bit of breakfast, lunch and dinner after  as directed by MD  Take medication  as directed by MD  Blood glucose reading: 1hpp 282  Patient instructed to monitor glucose levels: FBS: 60 - <90 2 hour: <120  Patient received the following handouts:  Nutrition Diabetes and Pregnancy  Carbohydrate Counting List  Meal Planning worksheet  Patient will be seen for follow-up as needed.

## 2013-10-12 ENCOUNTER — Other Ambulatory Visit (HOSPITAL_COMMUNITY): Payer: Self-pay | Admitting: Nurse Practitioner

## 2013-10-12 ENCOUNTER — Ambulatory Visit (HOSPITAL_COMMUNITY)
Admission: RE | Admit: 2013-10-12 | Discharge: 2013-10-12 | Disposition: A | Discharge status: Home / Self Care | Payer: Medicaid Other | Source: Ambulatory Visit | Attending: Physician Assistant | Admitting: Physician Assistant

## 2013-10-12 ENCOUNTER — Ambulatory Visit (HOSPITAL_COMMUNITY)
Admission: RE | Admit: 2013-10-12 | Discharge: 2013-10-12 | Disposition: A | Discharge status: Home / Self Care | Payer: Medicaid Other | Source: Ambulatory Visit | Attending: Nurse Practitioner | Admitting: Nurse Practitioner

## 2013-10-12 ENCOUNTER — Other Ambulatory Visit: Payer: Self-pay

## 2013-10-12 ENCOUNTER — Encounter (HOSPITAL_COMMUNITY): Payer: Self-pay

## 2013-10-12 DIAGNOSIS — E119 Type 2 diabetes mellitus without complications: Secondary | ICD-10-CM | POA: Insufficient documentation

## 2013-10-12 DIAGNOSIS — IMO0002 Reserved for concepts with insufficient information to code with codable children: Secondary | ICD-10-CM | POA: Insufficient documentation

## 2013-10-12 DIAGNOSIS — O24919 Unspecified diabetes mellitus in pregnancy, unspecified trimester: Secondary | ICD-10-CM | POA: Diagnosis not present

## 2013-10-12 DIAGNOSIS — Z363 Encounter for antenatal screening for malformations: Secondary | ICD-10-CM | POA: Insufficient documentation

## 2013-10-12 DIAGNOSIS — Z3689 Encounter for other specified antenatal screening: Secondary | ICD-10-CM

## 2013-10-12 DIAGNOSIS — Z1389 Encounter for screening for other disorder: Secondary | ICD-10-CM | POA: Diagnosis not present

## 2013-10-12 DIAGNOSIS — O09529 Supervision of elderly multigravida, unspecified trimester: Secondary | ICD-10-CM | POA: Insufficient documentation

## 2013-10-12 LAB — CYTOLOGY - PAP: PAP SMEAR: NEGATIVE

## 2013-10-12 NOTE — Progress Notes (Signed)
Genetic Counseling  High-Risk Gestation Note  Appointment Date:  10/12/2013 Referred By: Jean Mullingriplett, John J, PA-C Date of Birth:  02-23-1981   Pregnancy History: [id number] Estimated Date of Delivery: 04/15/14 Estimated Gestational Age: 4826w4d Attending: Particia NearingMartha Decker, MD   Jean Mays was seen for genetic counseling because of a maternal age of 33 y.o..  She will be 33 years old at delivery. Spanish/English Medical Interpreter, Doree BarthelAugustina, from [employer] provided interpretation for today's visit.    She was counseled regarding maternal age and the association with risk for chromosome conditions due to nondisjunction with aging of the ova.   We reviewed chromosomes, nondisjunction, and the associated 1 in 114 risk for fetal aneuploidy related to a maternal age of 33 y.o. at 6126w4d gestation.  She was counseled that the risk for aneuploidy decreases as gestational age increases, accounting for those pregnancies which spontaneously abort.  We specifically discussed Down syndrome (trisomy 4321), trisomies 6213 and 3118, and sex chromosome aneuploidies (47,XXX and 47,XXY) including the common features and prognoses of each.   We reviewed available screening options including First Screen, Quad screen, noninvasive prenatal screening (NIPS)/cell free DNA (cfDNA) testing, and detailed ultrasound.  She was counseled that screening tests are used to modify a patient's a priori risk for aneuploidy, typically based on age. This estimate provides a pregnancy specific risk assessment. We reviewed the benefits and limitations of each option. Specifically, we discussed the conditions for which each test screens, the detection rates, and false positive rates of each. She was also counseled regarding diagnostic testing via amniocentesis. We reviewed the approximate 1 in 300-500 risk for complications for amniocentesis, including spontaneous pregnancy loss. After consideration of all the options, she elected  to proceed with NIPS (Panorama).  Those results will be available in ~8-10 days.    Ultrasound was performed at the time of today's visit to establish gestational age. Complete ultrasound results reported separately.  The patient would like to return for a detailed ultrasound at ~18+ weeks gestation.  This appointment was scheduled for 11/23/13. She understands that screening tests cannot rule out all birth defects or genetic syndromes. The patient was advised of this limitation and states she still does not want additional testing at this time.   Both family histories were reviewed and found to be contributory for a female paternal first cousin once removed to the patient with intellectual disability. This relative is 35 years old and is not able to live independently. He reportedly has no physical health problems and no physical differences from relatives. An underlying cause is not known for his mental delays. [name] was counseled that there are many different causes of intellectual disabilities including environmental, multifactorial, and genetic etiologies.  We discussed that a specific diagnosis for intellectual disability can be determined in approximately 50% of these individuals.  In the remaining 50% of individuals, a diagnosis may never be determined.  Regarding genetic causes, we discussed that chromosome aberrations (aneuploidy, deletions, duplications, insertions, and translocations) are responsible for a small percentage of individuals with intellectual disability.  Many individuals with chromosome aberrations have additional differences, including congenital anomalies or minor dysmorphisms.  Likewise, single gene conditions are the underlying cause of intellectual delay in some families.  We discussed that many gene conditions have intellectual disability as a feature, but also often include other physical or medical differences. We discussed that without more specific information, it is  difficult to provide an accurate risk assessment.  Further genetic counseling is warranted if  more information is obtained.  Jean Mays denied exposure to environmental toxins or chemical agents. She denied the use of alcohol, tobacco or street drugs. She denied significant viral illnesses during the course of her pregnancy. Her medical and surgical histories were contributory for diabetes mellitus type II for which she is taking metformin.   I counseled Ms. Fuhrer regarding the above risks and available options.  The approximate face-to-face time with the genetic counselor was 40 minutes.  Quinn Plowman, MS,  Certified Genetic Counselor 10/12/2013

## 2013-10-13 ENCOUNTER — Encounter: Payer: Self-pay | Admitting: *Deleted

## 2013-10-18 DIAGNOSIS — E119 Type 2 diabetes mellitus without complications: Secondary | ICD-10-CM | POA: Insufficient documentation

## 2013-10-19 ENCOUNTER — Other Ambulatory Visit (HOSPITAL_COMMUNITY): Payer: Self-pay | Admitting: Maternal and Fetal Medicine

## 2013-10-19 DIAGNOSIS — Z0489 Encounter for examination and observation for other specified reasons: Secondary | ICD-10-CM

## 2013-10-19 DIAGNOSIS — IMO0002 Reserved for concepts with insufficient information to code with codable children: Secondary | ICD-10-CM

## 2013-10-20 DIAGNOSIS — O09522 Supervision of elderly multigravida, second trimester: Secondary | ICD-10-CM

## 2013-10-20 DIAGNOSIS — E669 Obesity, unspecified: Secondary | ICD-10-CM

## 2013-10-20 DIAGNOSIS — O09529 Supervision of elderly multigravida, unspecified trimester: Secondary | ICD-10-CM | POA: Insufficient documentation

## 2013-10-20 DIAGNOSIS — Z789 Other specified health status: Secondary | ICD-10-CM

## 2013-10-20 DIAGNOSIS — Z603 Acculturation difficulty: Secondary | ICD-10-CM

## 2013-10-22 ENCOUNTER — Telehealth (HOSPITAL_COMMUNITY): Payer: Self-pay | Admitting: MS"

## 2013-10-22 NOTE — Telephone Encounter (Signed)
Called Jean Mays to discuss her cell free fetal DNA test results via [employer] Spanish/English interpreter [phone].  Jean Mays had [place] testing through [facility].  Testing was offered because of advanced maternal age.   The patient was identified by name and DOB.  We reviewed that these are within normal limits, showing a less than 1 in 10,000 risk for trisomies 21, 18 and 13, and monosomy X (Turner syndrome).  In addition, the risk for triploidy/vanishing twin and sex chromosome trisomies (47,XXX and 47,XXY) was also low risk.  We reviewed that this testing identifies > 99% of pregnancies with trisomy 1621, trisomy 4313, sex chromosome trisomies (47,XXX and 47,XXY), and triploidy. The detection rate for trisomy 18 is 96%.  The detection rate for monosomy X is ~92%.  The false positive rate is <0.1% for all conditions. Testing was also consistent with female fetal sex.  The patient did wish to know fetal sex.  She understands that this testing does not identify all genetic conditions.  All questions were answered to her satisfaction, she was encouraged to call with additional questions or concerns.  Quinn PlowmanKaren Elizeo Rodriques, MS Certified Genetic Counselor 10/22/2013 4:10 PM

## 2013-10-25 ENCOUNTER — Encounter: Payer: Self-pay | Admitting: Family Medicine

## 2013-10-25 ENCOUNTER — Encounter: Payer: Medicaid Other | Admitting: *Deleted

## 2013-10-25 ENCOUNTER — Ambulatory Visit (INDEPENDENT_AMBULATORY_CARE_PROVIDER_SITE_OTHER): Payer: Medicaid Other | Admitting: Family Medicine

## 2013-10-25 VITALS — BP 114/65 | HR 78 | Temp 98.4°F | Wt 192.6 lb

## 2013-10-25 DIAGNOSIS — O24919 Unspecified diabetes mellitus in pregnancy, unspecified trimester: Secondary | ICD-10-CM

## 2013-10-25 DIAGNOSIS — O09529 Supervision of elderly multigravida, unspecified trimester: Secondary | ICD-10-CM

## 2013-10-25 DIAGNOSIS — O0990 Supervision of high risk pregnancy, unspecified, unspecified trimester: Secondary | ICD-10-CM

## 2013-10-25 DIAGNOSIS — O09522 Supervision of elderly multigravida, second trimester: Secondary | ICD-10-CM

## 2013-10-25 LAB — POCT URINALYSIS DIP (DEVICE)
Bilirubin Urine: NEGATIVE
Glucose, UA: NEGATIVE mg/dL
HGB URINE DIPSTICK: NEGATIVE
Nitrite: NEGATIVE
Protein, ur: NEGATIVE mg/dL
SPECIFIC GRAVITY, URINE: 1.02 (ref 1.005–1.030)
Urobilinogen, UA: 0.2 mg/dL (ref 0.0–1.0)
pH: 5.5 (ref 5.0–8.0)

## 2013-10-25 LAB — HEMOGLOBIN A1C
Hgb A1c MFr Bld: 8.1 % — ABNORMAL HIGH (ref <5.7)
MEAN PLASMA GLUCOSE: 186 mg/dL — AB (ref <117)

## 2013-10-25 MED ORDER — INSULIN NPH (HUMAN) (ISOPHANE) 100 UNIT/ML ~~LOC~~ SUSP: 12.0000 [IU] | SUBCUTANEOUS | Status: DC

## 2013-10-25 MED ORDER — INSULIN REGULAR HUMAN 100 UNIT/ML IJ SOLN: 12.0000 [IU] | Freq: Two times a day (BID) | INTRAMUSCULAR | Status: DC

## 2013-10-25 MED ORDER — ASPIRIN 81 MG PO CHEW: 81.0000 mg | CHEWABLE_TABLET | Freq: Every day | ORAL | Status: DC

## 2013-10-25 MED ORDER — "INSULIN SYRINGE 30G X 5/16"" 1 ML MISC": Status: DC

## 2013-10-25 NOTE — Progress Notes (Signed)
Patient was seen on 10/25/13 for Diabetes self-management education. Jean Mays has T2DM and is presently testing her glucose QID as directed. Her average FBS 104-169mg /dl. Her 2hpp range 83-260mg /dl.   Plan: Initiate insulin Insulin Instruction  Insulin Action of NPH & Regular  Reviewed syringe & vial including # units per syringe,   Hygiene and storage  Drawing up single vials  Rotation of Sites  Hypoglycemia- symptoms, causes , treatment choices  Record keeping and MD follow up  Hypoglycemia, causes, symptoms and treatment   Patient demonstrated understanding of insulin administration by return demonstration on teaching aide.  Patient received the following handouts:  Mixing Insulin Brochure by [facility] Getting Started                                        Patient to start on insulin as Rx'd by MD  Patient instructed to monitor glucose levels: FBS: 60 - <90 2 hour: <120  I have requested f/u with me to review glucose readings

## 2013-10-25 NOTE — Patient Instructions (Addendum)
A raz de una dieta [place] y Radio producer nivel de azcar en la sangre bajo control es la cosa ms importante que hacer para su salud y la de su beb por Psychologist, clinical.  Por favor verifique su nivel de azcar en la sangre 4 veces al da.  Por favor, mantenga registros BS precisos y llevarlos con usted a cada visita. Por favor traiga su metro [place].  Las metas para el azcar de la sangre deben ser: 1. El ayuno (primera hora de la maana antes de comer) debe ser inferior a 90. 2. 2 horas despus de las comidas deben ser inferiores a 120.  Por favor, comer 3 comidas y 3 meriendas. Incluya protenas (carne, lcteos, queso, huevos, frutos secos) con todas las comidas.  Tenga en cuenta que los carbohidratos aumentan el nivel de azcar en la sangre. No slo alimentos dulces (galletas, pasteles, rosquillas, fruta, jugo, refresco), sino tambin el pan, pasta, arroz y patatas. Usted tiene que limitar la cantidad de carbohidratos que est comiendo.  Adicin de ejercicio, tan slo 30 minutos al da puede reducir Engineer, agricultural.   Diabetes mellitus gestacional (Gestational Diabetes Mellitus) La diabetes mellitus gestacional, ms comnmente conocida como diabetes gestacional es un tipo de diabetes que desarrollan algunas mujeres durante el [place]. En la diabetes gestacional, el pncreas no produce suficiente insulina (una hormona) o las clulas son menos sensibles a la insulina producida (resistencia a la insulina), o ambas cosas. Normalmente, la [employer] azcares de los alimentos a las clulas de los tejidos. Las clulas de los tejidos C[employer] Corporation azcares para Psychiatrist. La falta de insulina o la falta de una respuesta normal a la insulina hace que el exceso de azcar se acumule en la sangre en lugar de Customer service manager en las clulas de los tejidos. Como resultado, se producen niveles altos de Banker (hiperglucemia). El [address] de los niveles altos de International aid/development worker (glucosa) puede causar  muchos problemas.  FACTORES DE RIESGO Usted tiene mayor probabilidad de desarrollar diabetes gestacional si tiene antecedentes familiares de diabetes y tambin si tiene uno o ms de los siguientes factores de riesgo:  ndice de masa corporal superior a 30 (obesidad).  Embarazo previo con diabetes gestacional.  La edad avanzada en el momento del embarazo. Si se mantienen los niveles de glucosa en la sangre en un rango normal durante el [place], las mujeres pueden tener un embarazo saludable. Si los niveles de glucosa en la sangre no estn bien controlados, puede haber riesgos para usted, el feto o el recin nacido, o durante el [place] de parto y [place].  SNTOMAS  Si se presentan sntomas, stos son similares a los sntomas que normalmente experimentar durante el embarazo. Los sntomas de la diabetes gestacional son:   Wyvonnia Dusky de la sed (polidipsia).  Aumento de la miccin (poliuria).  Orina con ms frecuencia durante la noche (nocturia).  Prdida de peso. La prdida de peso puede ser muy rpida.  Infecciones frecuentes y recurrentes.  Cansancio (fatiga).  Debilidad.  Cambios en la visin, como visin borrosa.  Olor a Water quality scientist.  Dolor abdominal. DIAGNSTICO La diabetes se diagnostica cuando hay aumento de los niveles de glucosa en la [place]. El nivel de glucosa en la sangre puede controlarse en uno o ms de los siguientes anlisis de sangre:  Medicin de glucosa en la sangre en [place]. No se le permitir comer durante al menos 8 horas antes de que se tome [place] de [place].  Pruebas  al azar de glucosa en la sangre. El nivel de glucosa en la sangre se controla en cualquier momento del da sin importar el momento en que haya comido.  Prueba de A1c (hemoglobina glucosilada) Una prueba de A1c proporciona informacin sobre el control de la glucosa en la sangre durante los ltimos 3 meses.  Prueba de tolerancia a la glucosa oral (PTGO). La glucosa en la sangre se  mide despus de no haber comido (ayunas) durante una a tres horas y despus de beber una bebida que contenga glucosa. Dado que las hormonas que causan la resistencia a la insulina son ms altas alrededor Countrywide Financial 24 a 28 de Psychiatrist, generalmente se realiza una PTGO durante ese tiempo. Si tiene factores de riesgo de diabetes gestacional, su mdico puede hacerle estudios de Airline pilot antes de las 24semanas de [place]. TRATAMIENTO   Usted tendr que tomar medicamentos para la diabetes o insulina diariamente para Pharmacologist los niveles de glucosa en la sangre en el rango deseado.  Usted tendr [employer] dosis de insulina con la actividad fsica y la eleccin de alimentos saludables. El objetivo del tratamiento es mantener el nivel de azcar en la sangre previo a comer (preprandial) y durante la noche entre 60 y 99mg /dl, durante todo el [place]. El objetivo del tratamiento es mantener el nivel pico de azcar en la sangre despus de comer (glucosa posprandial) entre 100y 140mg /dl. INSTRUCCIONES PARA EL CUIDADO EN EL HOGAR   Controle su nivel de hemoglobina A1c dos veces al ao.  Contrlese a diario el nivel de glucosa en la sangre segn las indicaciones de su mdico. Es comn Education officer, environmental controles frecuentes de la glucosa en la [place].  Supervise las cetonas en la orina cuando est enferma y segn las indicaciones de su mdico.  Tome el medicamento para la diabetes y adminstrese insulina segn las indicaciones de su mdico para Radio producer nivel de glucosa en la sangre en el rango deseado.  Nunca se quede sin medicamento para la diabetes o sin insulina. Es necesario que la reciba [employer].  Ajuste la insulina segn la ingesta de hidratos de carbono. Los hidratos de carbono pueden aumentar los niveles de glucosa en la sangre, pero deben incluirse en su dieta. Los hidratos de carbono aportan vitaminas, minerales y [place] que son Neomia Dear parte esencial de una dieta saludable. Los hidratos  de carbono se encuentran en frutas, verduras, cereales integrales, productos lcteos, legumbres y alimentos que contienen azcares aadidos.  Consuma alimentos saludables. Alterne 3 comidas con 3 colaciones.  Aumente de peso saludablemente. El aumento del peso total vara de acuerdo con el ndice de masa corporal que tena antes del embarazo [facility]).  Lleve una tarjeta de alerta mdica o use una pulsera o medalla de alerta mdica.  Lleve con usted una colacin de 15gramos de hidratos de carbono en todo momento para controlar los niveles bajos de glucosa en la sangre (hipoglucemia). Algunos ejemplos de colaciones de 15gramos de hidratos de carbono son los siguientes:  Tabletas de glucosa, 3 o 4.  Gel de glucosa, tubo de 15 gramos.  Pasas de uva, 2 cucharadas (24 g).  Caramelos de goma, 6.  Galletas de [place], 8.  Jugo de fruta, gaseosa comn, o [place], 4 onzas (120 ml).  Pastillas de goma, 9.  Reconocer la hipoglucemia. Durante el embarazo la hipoglucemia se produce cuando hay niveles de glucosa en la sangre de 60 mg/dl o menos. El riesgo de hipoglucemia aumenta durante el ayuno o cuando se saltea  las comidas, durante o despus de Education officer, environmental ejercicio intenso y Kenvil duerme. Los sntomas de hipoglucemia son:  Temblores o sacudidas.  Disminucin de la capacidad de concentracin.  Sudoracin.  Aumento de la frecuencia cardaca.  Dolor de [place].  Sequedad en la boca.  Hambre.  Irritabilidad.  Ansiedad.  Sueo agitado.  Alteracin del habla o de la coordinacin.  Confusin.  Tratar la hipoglucemia rpidamente. Si usted est alerta y puede tragar con seguridad, siga la regla de 15/15 que consiste en:  [employer] 15 y 20gramos de glucosa de accin rpida o carbohidratos. Las opciones de accin rpida son un gel de glucosa, tabletas de glucosa, o 4 onzas (120 ml) de jugo de frutas, gaseosa comn, o leche baja en grasa.  Compruebe su nivel de glucosa en la  sangre 15 minutos despus de tomar la glucosa.  Tome entre 15 y 20 gramos ms de glucosa si el nivel de glucosa en la sangre todava es de 70mg /dl o inferior.  Ingiera una comida o una colacin en el lapso de 1 hora una vez que los niveles de glucosa en la sangre vuelven a la normalidad.  Est atento a la poliuria (miccin excesiva) y la polidipsia (sensacin de mucha sed), que son los primeros signos de la hiperglucemia. El reconocimiento temprano de la hiperglucemia permite un tratamiento oportuno. Trate la hiperglucemia segn le indic su mdico.  Haga actividad fsica por lo menos al da o como lo indique su mdico. Se recomienda que 30 minutos despus de cada comida, realice diez minutos de actividad fsica para controlar los niveles de glucosa postprandial en la [place].  Ajuste su dosis de insulina y la ingesta de alimentos, segn sea necesario, si inicia un nuevo ejercicio o deporte.  Siga su plan para los [address] de enfermedad cuando no pueda comer o beber como de [place].  Evite el tabaco y el alcohol.  Concurra a todas las visitas de control como se lo haya indicado el mdico.  Siga el consejo del mdico respecto a los controles prenatales y posteriores al parto (postparto), las visitas, la planificacin de las comidas, el ejercicio, los medicamentos, las vitaminas, los anlisis de [place], otras pruebas mdicas y [place] fsicas.  Realice diariamente el cuidado de la piel y de los pies. Examine su piel y los pies diariamente para ver si tiene cortes, moretones, enrojecimiento, problemas en las uas, sangrado, ampollas o Advertising account planner.  Cepllese los dientes y encas por lo menos dos veces al da y use hilo dental al menos una vez por da. Concurra regularmente a las visitas de control con el dentista.  Programe un examen de vista durante el primer trimestre de su embarazo o como lo indique su mdico.  Comparta su plan de control de diabetes en el trabajo o en la  escuela.  Mantngase al da con las vacunas.  Aprenda a Dealer.  Obtenga la mayor cantidad posible de informacin sobre la diabetes y solicite ayuda siempre que sea necesario.  Obtenga informacin sobre el amamantamiento y analice esta posibilidad.  Debe controlar el nivel de azcar en la sangre de 6a 12semanas despus del parto. Esto se hace con una prueba de tolerancia a la glucosa oral (PTGO). SOLICITE ATENCIN MDICA SI:   No puede comer alimentos o beber por ms de 6 horas.  Tuvo nuseas o ha vomitado durante ms de 6 horas.  Tiene un nivel de glucosa en la sangre de 200 mg/dl y cetonas en la orina.  Presenta algn cambio en  el estado mental.  Desarrolla problemas de visin.  Sufre un dolor persistente de Training and development officercabeza.  Siente dolor o molestias en la parte superior del abdomen.  Desarrolla una enfermedad grave adicional.  Tuvo diarrea durante ms de 6 horas.  Ha estado enfermo o ha tenido fiebre durante un par [address] y no mejora. SOLICITE ATENCIN MDICA DE INMEDIATO SI:   Tiene dificultad para respirar.  Ya no siente los movimientos del beb.  Est sangrando o tiene flujo vaginal.  Comienza a tener contracciones o trabajo de Laurelparto prematuro. ASEGRESE DE QUE:  Comprende estas instrucciones.  Controlar su afeccin.  Recibir ayuda de inmediato si no mejora o si empeora. Document Released: 12/26/2004 Document Revised: 08/02/2013 [facility] Patient Information 2015 RectorExitCare, MarylandLLC. This information is not intended to replace advice given to you by your health care provider. Make sure you discuss any questions you have with your health care provider.  Lactancia materna (Breastfeeding) Decidir Museum/gallery exhibitions officeramamantar es una de las mejores elecciones que puede hacer por usted y su beb. El cambio hormonal durante el Psychiatristembarazo produce el desarrollo del tejido mamario y Lesothoaumenta la cantidad y el tamao de los conductos galactforos. Estas hormonas tambin permiten que las  protenas, los azcares y las grasas de la sangre produzcan la WPS Resourcesleche materna en las glndulas productoras de Deer Parkleche. Las hormonas impiden que la leche materna sea liberada antes del nacimiento del beb, adems de impulsar el flujo de leche luego del nacimiento. Una vez que ha comenzado a Museum/gallery exhibitions officeramamantar, Conservation officer, naturepensar en el beb, as Immunologistcomo la succin o Theatre managerel llanto, pueden estimular la liberacin de Corydonleche de las glndulas productoras de Jamestownleche.  LOS BENEFICIOS DE AMAMANTAR Para el beb  La primera leche (calostro) ayuda a Careers information officermejorar el funcionamiento del sistema digestivo del beb.  La leche tiene anticuerpos que ayudan a Radio producerprevenir las infecciones en el beb.  El beb tiene una menor incidencia de asma, alergias y del sndrome de muerte sbita del lactante.  Los nutrientes en la Cudjoe Keyleche materna son mejores para el beb que la Shilohleche maternizada y estn preparados exclusivamente para cubrir las necesidades del beb.  La leche materna mejora el desarrollo cerebral del beb.  Es menos probable que el beb desarrolle otras enfermedades, como obesidad infantil, asma o diabetes mellitus de tipo 2. Para usted   La lactancia materna favorece el desarrollo de un vnculo muy especial entre la madre y el beb.  Es conveniente. La leche materna siempre est disponible a la Human resources officertemperatura correcta y es Ogdensburgeconmica.  La lactancia materna ayuda a quemar caloras y a perder el peso ganado durante el Jalembarazo.  Favorece la contraccin del tero al tamao que tena antes del embarazo de manera ms rpida y disminuye el sangrado (loquios) despus del parto.  La lactancia materna contribuye a reducir Nurse, adultel riesgo de desarrollar diabetes mellitus de tipo 2, osteoporosis o cncer de mama o de ovario en el futuro. SIGNOS DE QUE EL BEB EST HAMBRIENTO Primeros signos de [address]  Aumenta su estado de Lesothoalerta o actividad.  Se estira.  Mueve la cabeza de un lado a otro.  Mueve la cabeza y abre la boca cuando se le toca la mejilla o la  comisura de la boca (reflejo de bsqueda).  Aumenta las vocalizaciones, tales como sonidos de succin, se relame los labios, emite arrullos, suspiros, o chirridos.  Mueve la Jones Apparel Groupmano hacia la boca.  Se chupa con ganas los dedos o las manos. Signos tardos de Fisher Scientifichambre  Est agitado.  Llora de manera intermitente. Signos de  hambre extrema Los signos de hambre extrema requerirn que lo calme y lo consuele antes de que el beb pueda alimentarse adecuadamente. No espere a que se manifiesten los siguientes signos de hambre extrema para comenzar a Museum/gallery exhibitions officer:   Designer, jewellery.  Llanto intenso y fuerte.   Gritos. INFORMACIN BSICA SOBRE LA LACTANCIA MATERNA Iniciacin de la lactancia materna  Encuentre un lugar cmodo para sentarse o acostarse, con un buen respaldo para el cuello y la espalda.  Coloque una almohada o una manta enrollada debajo del beb para acomodarlo a la altura de la mama (si est sentada). Las almohadas para Museum/gallery exhibitions officer se han diseado especialmente a fin de servir de apoyo para los brazos y el beb [employer].  Asegrese de que el abdomen del beb est frente al suyo.  Masajee suavemente la mama. Con las yemas de los dedos, masajee la pared del pecho hacia el pezn en un movimiento circular. Esto estimula el flujo de [place]. Es posible que Engineer, manufacturing systems este movimiento mientras amamanta si la leche fluye lentamente.  Sostenga la mama con el pulgar por arriba del pezn y los otros 4 dedos por debajo de la mama. Asegrese de que los dedos se encuentren lejos del pezn y de la boca del beb.  Empuje suavemente los labios del beb con el pezn o con el dedo.  Cuando la boca del beb se abra lo suficiente, acrquelo rpidamente a la mama e introduzca todo el pezn y la zona oscura que lo rodea (areola), tanto como sea posible, dentro de la boca del beb.  Debe haber ms areola visible por arriba del labio superior del beb que por debajo del labio inferior.  La lengua del  beb debe estar entre la enca inferior y la [place].  Asegrese de que la boca del beb est en la posicin correcta alrededor del pezn (prendida). Los labios del beb deben crear un sello sobre la mama y estar doblados hacia afuera (invertidos).  Es comn que el beb succione durante 2 a 3 minutos para que comience el flujo de [place]. Cmo debe prenderse Es muy importante que le ensee al beb cmo prenderse adecuadamente a la mama. Si el beb no se prende adecuadamente, puede causarle dolor en el pezn y reducir la produccin de [place], y hacer que el beb tenga un escaso aumento de [place]. Adems, si el beb no se prende adecuadamente al pezn, puede tragar aire durante la alimentacin. Esto puede causarle molestias al beb. Hacer eructar al beb al [place] de mama puede ayudarlo a liberar el aire. Sin embargo, ensearle al beb cmo prenderse a la mama adecuadamente es la mejor manera de evitar que se sienta molesto por tragar Oceanographer se alimenta. Signos de que el beb se ha prendido adecuadamente al pezn:   Payton Doughty o succiona de modo silencioso, sin causarle dolor.  Se escucha que traga cada 3 o 4 succiones.   Hay movimientos musculares por arriba y por delante de sus odos al Printmaker. Signos de que el beb no se ha prendido Audiological scientist al pezn:   Hace ruidos de succin o de chasquido mientras se alimenta.  Siente dolor en el pezn. Si cree que el beb no se prendi correctamente, deslice el dedo en la comisura de la boca y [employer] las encas del beb para interrumpir la succin. Intente comenzar a amamantar nuevamente. Signos de Fish farm manager Signos del beb:   Disminuye gradualmente el nmero de succiones o cesa la succin por completo.  Se duerme.  Relaja el cuerpo.  Retiene una pequea cantidad de [facility] boca.  Se desprende solo del pecho. Signos que presenta usted:  Las mamas han aumentado la firmeza, el peso y el tamao 1 a  3 horas despus de Museum/gallery exhibitions officer.  Estn ms blandas inmediatamente despus de amamantar.  Un aumento del volumen de [place], y tambin un cambio en su consistencia y color se producen hacia el quinto da de Tour manager.  Los pezones no duelen, ni estn agrietados ni sangran. Signos de que su beb recibe la cantidad de leche suficiente  Moja al menos 3 paales en 24 horas. La orina debe ser clara y de color amarillo plido a los 5 [address] de [place].  Defeca al menos 3 veces en 24 horas a los 5 [address] de [address] La materia fecal debe ser blanda y [place].  Defeca al menos 3 veces en 24 horas a los [address] de [address] La materia fecal debe ser grumosa y [place].  No registra una prdida de peso mayor del 10% del peso al nacer durante los primeros 3 [address] de [place].  Aumenta de peso un promedio de 4 a 7onzas (113 a 198g) por semana despus de los 4 [address] de vida.  Aumenta de [place], [place], de [place] uniforme a Glass blower/designer de los 5 [address] de vida, sin Passenger transport manager prdida de peso despus de las 2semanas de vida. Despus de alimentarse, es posible que el beb regurgite una pequea cantidad. Esto es frecuente. FRECUENCIA Y DURACIN DE LA LACTANCIA MATERNA El amamantamiento frecuente la ayudar a producir ms [place] y a Education officer, community de Engineer, mining en los pezones e hinchazn en las [place]. Alimente al beb cuando muestre signos de hambre o si siente la necesidad de reducir la congestin de las [place]. Esto se denomina "lactancia a demanda". Evite el uso del chupete mientras trabaja para establecer la lactancia (las primeras 4 a 6 semanas despus del nacimiento del beb). Despus de este perodo, podr ofrecerle un chupete. Las investigaciones demostraron que el uso del chupete durante el primer ao de vida del beb disminuye el riesgo de desarrollar el sndrome de muerte sbita del lactante (SMSL). Permita que el nio se alimente en cada mama todo lo que desee. Contine amamantando al beb hasta que haya terminado  de alimentarse. Cuando el beb se desprende o se queda dormido mientras se est alimentando de la primera mama, ofrzcale la segunda. Debido a que, con frecuencia, los recin [place] las primeras semanas de vida, es posible que deba despertar al beb para alimentarlo. Los horarios de Acupuncturist de un beb a otro. Sin embargo, las siguientes reglas pueden servir como gua para ayudarla a Lawyer que el beb se alimenta adecuadamente:  Se puede amamantar a los recin nacidos (bebs de 4 semanas o menos de vida) cada 1 a 3 horas.  No deben transcurrir ms de 3 horas durante el da o 5 horas durante la noche sin que se amamante a los recin nacidos.  Debe amamantar al beb 8 veces como mnimo en un perodo de 24 horas, hasta que comience a introducir slidos en su dieta, a los 6 meses de vida aproximadamente. EXTRACCIN DE [employer] MATERNA La extraccin y Contractor de la leche materna le permiten asegurarse de que el beb se alimente exclusivamente de [place], aun en momentos en los que no puede amamantar. Esto tiene especial importancia si debe regresar al Aleen Campi en el perodo en que an est amamantando o si no  puede estar presente en los momentos en que el beb debe alimentarse. Su asesor en lactancia puede orientarla sobre cunto tiempo es seguro almacenar [place].  El sacaleche es un aparato que le permite extraer leche de la mama a un recipiente estril. Luego, la leche materna extrada puede almacenarse en un refrigerador o Electrical engineer. Algunos sacaleches son Birdie Riddle, Delaney Meigs otros son elctricos. Consulte a su asesor en lactancia qu tipo ser ms conveniente para usted. Los sacaleches se pueden comprar; sin embargo, algunos hospitales y grupos de apoyo a la lactancia materna alquilan Sports coach. Un asesor en lactancia puede ensearle cmo extraer [employer], en caso de que prefiera no usar un sacaleche.  CMO CUIDAR  LAS MAMAS DURANTE LA LACTANCIA MATERNA Los pezones se secan, agrietan y duelen durante la Tour manager. Las siguientes recomendaciones pueden ayudarla a Pharmacologist las [employer] Partners y sanas:  Careers information officer usar jabn en los pezones.  Use un sostn de soporte. Aunque no son esenciales, las camisetas sin mangas o los sostenes especiales para Museum/gallery exhibitions officer estn diseados para acceder fcilmente a las mamas, para Museum/gallery exhibitions officer sin tener que quitarse todo el sostn o la camiseta. Evite usar sostenes con aro o sostenes muy ajustados.  Seque al aire sus pezones durante 3 a despus de amamantar al beb.  Utilice solo apsitos de Haematologist sostn para Environmental health practitioner las prdidas de [place]. La prdida de un poco de [employer] Enterprise Group tomas es normal.  Utilice lanolina sobre los pezones luego de Museum/gallery exhibitions officer. La lanolina ayuda a mantener la humedad normal de la piel. Si [place] lanolina pura, no tiene que lavarse los pezones antes de volver a Corporate treasurer al beb. La lanolina pura no es txica para el beb. Adems, puede extraer [employer] algunas gotas de [place] materna y Engineer, maintenance (IT) suavemente esa [employer], para que la [place] se seque al aire. Durante las primeras semanas despus de dar a luz, algunas mujeres pueden experimentar hinchazn en las mamas (congestin [place]). La congestin puede hacer que sienta las mamas pesadas, calientes y sensibles al tacto. El pico de la congestin ocurre dentro de los 3 a 5 das despus del [place]. Las siguientes recomendaciones pueden ayudarla a Paramedic la congestin:  Vace por completo las mamas al [employer] o Environmental health practitioner. Puede aplicar calor hmedo en las mamas (en la ducha o con toallas hmedas para manos) antes de Museum/gallery exhibitions officer o extraer [employer] Resources. Esto aumenta la circulacin y [place] a que la [place]. Si el beb no vaca por completo las [address] cuando lo [address], extraiga la Manuel Garcia restante despus de que haya finalizado.  Use un sostn ajustado (para amamantar o  comn) o una camiseta sin mangas durante 1 o 2 das para indicar al cuerpo que disminuya ligeramente la produccin de [place].  Aplique compresas de hielo [employer], a menos que le resulte demasiado incmodo.  Asegrese de que el beb est prendido y se encuentre en la posicin correcta mientras lo alimenta. Si la congestin persiste luego de 48 horas o despus de seguir estas recomendaciones, comunquese con su mdico o un Holiday representative. RECOMENDACIONES GENERALES PARA EL CUIDADO DE LA SALUD DURANTE LA LACTANCIA MATERNA  Consuma alimentos saludables. Alterne comidas y colaciones, y coma 3 de cada una por da. Dado que lo que come [employer], es posible que algunas comidas hagan que su beb se vuelva ms irritable de lo habitual. Evite comer este tipo de alimentos si percibe que afectan de manera negativa al beb.  Beba leche, jugos de fruta y agua para Patent examiner su sed (aproximadamente 10 vasos al Futures trader).  Descanse con frecuencia, reljese y tome sus vitaminas prenatales para evitar la fatiga, el estrs y la anemia.  Contine con los autocontroles de la mama.  Evite masticar y fumar tabaco.  Evite el consumo de alcohol y drogas. Algunos medicamentos, que pueden ser perjudiciales para el beb, pueden pasar a travs de la [employer]. Es importante que consulte a su mdico antes de Medical sales representative, incluidos todos los medicamentos recetados y de [place], as como los suplementos vitamnicos y herbales. Puede quedar embarazada durante la lactancia. Si desea controlar la natalidad, consulte a su mdico cules son las opciones ms seguras para el beb. SOLICITE ATENCIN MDICA SI:   Usted siente que quiere dejar de Museum/gallery exhibitions officer o se siente frustrada con la lactancia.  Siente dolor en las mamas o en los pezones.  Sus pezones estn agrietados o Water quality scientist.  Sus pechos estn irritados, sensibles o calientes.  Tiene un rea hinchada en cualquiera de las  mamas.  Siente escalofros o fiebre.  Tiene nuseas o vmitos.  Presenta una secrecin de otro lquido distinto de la leche materna de los pezones.  Sus mamas no se llenan antes de Museum/gallery exhibitions officer al beb para el quinto da despus del [place].  Se siente triste y deprimida.  El beb est demasiado somnoliento como para comer bien.  El beb tiene problemas para dormir.  Moja menos de 3 paales en 24 horas.  Defeca menos de 3 veces en 24 horas.  La piel del beb o la parte blanca de los ojos se vuelven amarillentas.  El beb no ha aumentado de [place] a los [address] de [place]. SOLICITE ATENCIN MDICA DE INMEDIATO SI:   El beb est muy cansado Retail buyer) y no se quiere despertar para comer.  Le sube la fiebre sin causa. Document Released: 03/18/2005 Document Revised: 03/23/2013 [facility] Patient Information 2015 [place], [place]. This information is not intended to replace advice given to you by your health care provider. Make sure you discuss any questions you have with your health care provider.

## 2013-10-25 NOTE — Progress Notes (Signed)
New OB from [facility], New ob educational packet given.  Contacted [facility] for full records.

## 2013-10-25 NOTE — Progress Notes (Signed)
Transfer from the HD with 1 hour of 315 Taking Metformin and Glyburide FBS 104-169 2 hour pp 83-260 Almost all blood sugars are too high--insulin start NPH 31 u am, 12u q hs Reg 15 u q am, 12u q ac dinner

## 2013-10-26 ENCOUNTER — Encounter: Payer: Self-pay | Admitting: Family Medicine

## 2013-10-26 LAB — COMPREHENSIVE METABOLIC PANEL
ALBUMIN: 3.8 g/dL (ref 3.5–5.2)
ALT: 15 U/L (ref 0–35)
AST: 11 U/L (ref 0–37)
Alkaline Phosphatase: 57 U/L (ref 39–117)
BUN: 5 mg/dL — AB (ref 6–23)
CALCIUM: 9 mg/dL (ref 8.4–10.5)
CHLORIDE: 103 meq/L (ref 96–112)
CO2: 23 meq/L (ref 19–32)
Creat: 0.52 mg/dL (ref 0.50–1.10)
GLUCOSE: 126 mg/dL — AB (ref 70–99)
POTASSIUM: 3.6 meq/L (ref 3.5–5.3)
SODIUM: 134 meq/L — AB (ref 135–145)
TOTAL PROTEIN: 6.9 g/dL (ref 6.0–8.3)
Total Bilirubin: 0.3 mg/dL (ref 0.2–1.2)

## 2013-10-26 LAB — TSH: TSH: 1.696 u[IU]/mL (ref 0.350–4.500)

## 2013-10-27 ENCOUNTER — Encounter: Payer: Self-pay | Admitting: *Deleted

## 2013-10-27 ENCOUNTER — Other Ambulatory Visit: Payer: Self-pay | Admitting: *Deleted

## 2013-10-27 MED ORDER — INSULIN NPH (HUMAN) (ISOPHANE) 100 UNIT/ML ~~LOC~~ SUSP: 12.0000 [IU] | Freq: Two times a day (BID) | SUBCUTANEOUS | Status: DC

## 2013-10-27 MED ORDER — INSULIN REGULAR HUMAN 100 UNIT/ML IJ SOLN: 12.0000 [IU] | Freq: Three times a day (TID) | INTRAMUSCULAR | Status: DC

## 2013-10-27 NOTE — Progress Notes (Signed)
Change in Insulin order to ReliOn brand at request of Candace RN. No dosing change made. Confirmed with pharmacist that change received. Candace notified of completion of change order. Inda MerlinN. Ramy Greth, RN, BSN, CDE

## 2013-10-27 NOTE — Addendum Note (Signed)
Addended by: Rosendo GrosHALPIN, Rayane Gallardo L on: 10/27/2013 11:14 AM   Modules accepted: Orders

## 2013-11-01 ENCOUNTER — Encounter: Payer: Medicaid Other | Attending: Obstetrics & Gynecology | Admitting: *Deleted

## 2013-11-01 ENCOUNTER — Encounter: Payer: Self-pay | Admitting: Family Medicine

## 2013-11-01 ENCOUNTER — Encounter: Payer: Self-pay | Admitting: Obstetrics & Gynecology

## 2013-11-01 ENCOUNTER — Ambulatory Visit (INDEPENDENT_AMBULATORY_CARE_PROVIDER_SITE_OTHER): Payer: Medicaid Other | Admitting: Obstetrics & Gynecology

## 2013-11-01 VITALS — BP 116/69 | HR 86 | Temp 98.2°F | Wt 194.9 lb

## 2013-11-01 DIAGNOSIS — O09522 Supervision of elderly multigravida, second trimester: Secondary | ICD-10-CM

## 2013-11-01 DIAGNOSIS — O24919 Unspecified diabetes mellitus in pregnancy, unspecified trimester: Secondary | ICD-10-CM | POA: Diagnosis not present

## 2013-11-01 DIAGNOSIS — Z713 Dietary counseling and surveillance: Secondary | ICD-10-CM | POA: Insufficient documentation

## 2013-11-01 DIAGNOSIS — O09529 Supervision of elderly multigravida, unspecified trimester: Secondary | ICD-10-CM

## 2013-11-01 DIAGNOSIS — E119 Type 2 diabetes mellitus without complications: Secondary | ICD-10-CM | POA: Insufficient documentation

## 2013-11-01 DIAGNOSIS — O0990 Supervision of high risk pregnancy, unspecified, unspecified trimester: Secondary | ICD-10-CM

## 2013-11-01 DIAGNOSIS — Z3480 Encounter for supervision of other normal pregnancy, unspecified trimester: Secondary | ICD-10-CM

## 2013-11-01 LAB — POCT URINALYSIS DIP (DEVICE)
Bilirubin Urine: NEGATIVE
GLUCOSE, UA: NEGATIVE mg/dL
HGB URINE DIPSTICK: NEGATIVE
Ketones, ur: NEGATIVE mg/dL
Leukocytes, UA: NEGATIVE
NITRITE: NEGATIVE
PROTEIN: 30 mg/dL — AB
SPECIFIC GRAVITY, URINE: 1.025 (ref 1.005–1.030)
UROBILINOGEN UA: 0.2 mg/dL (ref 0.0–1.0)
pH: 6 (ref 5.0–8.0)

## 2013-11-01 LAB — CREATININE CLEARANCE, URINE, 24 HOUR
CREATININE: 0.52 mg/dL (ref 0.50–1.10)
Creatinine Clearance: 270 mL/min — ABNORMAL HIGH (ref 75–115)
Creatinine, 24H Ur: 2020 mg/d — ABNORMAL HIGH (ref 700–1800)
Creatinine, Urine: 106.3 mg/dL

## 2013-11-01 LAB — PROTEIN, URINE, 24 HOUR
PROTEIN 24H UR: 76 mg/d (ref 50–100)
Protein, Urine: 4 mg/dL

## 2013-11-01 MED ORDER — INSULIN REGULAR HUMAN 100 UNIT/ML IJ SOLN: 12.0000 [IU] | Freq: Two times a day (BID) | INTRAMUSCULAR | Status: DC

## 2013-11-01 MED ORDER — INSULIN NPH (HUMAN) (ISOPHANE) 100 UNIT/ML ~~LOC~~ SUSP: 14.0000 [IU] | Freq: Two times a day (BID) | SUBCUTANEOUS | Status: DC

## 2013-11-01 NOTE — Progress Notes (Signed)
Clarified insulin regimen. Instructed patient on increase of NPH HS to 14 units. To return to RN, CDE for glucose review.

## 2013-11-01 NOTE — Patient Instructions (Signed)
Cmo y dnde aplicar inyecciones de insulina por va subcutnea en adultos (How and Where to Give Subcutaneous Insulin Injections, Adult) Las personas con diabetes de tipo 1 deben administrarse insulina debido a que sus cuerpos no la producen. Las personas con diabetes tipo 2 pueden requerir insulina. Existen muchos tipos diferentes de Nevadainsulina, as como tambin otros medicamentos inyectables para la diabetes, que se deben Energy managerinyectar en la capa de tejido graso que se encuentra debajo de la piel. El tipo de insulina o el medicamento inyectable para la diabetes que tome puede determinar cuntas inyecciones deber administrarse y cundo deben aplicarse.  ELEGIR UNA ZONA PARA LA INYECCIN: La absorcin de insulina vara de un sitio a otro. Al igual que con cualquier medicamento inyectable, se recomienda inyectar la insulina dentro de la misma regin del cuerpo. Sin embargo, la insulina no debe inyectarse en la misma zona cada vez que se administre. Rotar las zonas para las Investment banker, corporateinyecciones evitar la inflamacin o la degradacin de los tejidos. Hay cuatro regiones principales que pueden utilizarse para las inyecciones. Las regiones incluyen:  Abdomen (regin de preferencia, especialmente para medicamentos inyectables para la diabetes diferentes de la insulina).  La parte anterior y superior externa de los muslos.  La parte posterior superior del brazo  Los glteos UTILIZACIN DE LA [place]INGA [place]LA AMPOLLA Administrar insulina: nica dosis 1. Lave sus manos con agua y Belarusjabn. 2. Haga rodar suavemente el frasco de insulina (ampolla) entre sus manos para Maurymezclarla. No agite la ampolla. 3. Limpie el tapn de goma de la ampolla con un hisopo con alcohol. Asegrese de eliminar la parte plstica superior de las ampollas nuevas. 4. Retire la cubierta plstica de la aguja que est en la [place] Crossingjeringa. No deje que la aguja toque nada. 5. Tire del mbolo para extraer el aire de la [place] Dun[place]eringa. Debe tener la misma cantidad de aire  que de dosis de Merrillinsulina. 6. Inserte la aguja a travs de la tapa de goma de la ampolla. No d vuelta la ampolla. 7. Empuje todo el mbolo para pasar el aire a la ampolla. 8. Deje la aguja en la ampolla. Luego, d vuelta la ampolla y la Forsanjeringa. 9. Tire lentamente del mbolo, para que ingrese la cantidad de insulina que necesita a Research scientist (life sciences)la jeringa. 10. Observe si quedaron burbujas de E. I. du Pontaire en la jeringa. Podr necesitar empujar el mbolo hacia arriba y Bassetthacia abajo 2 o 3 veces para eliminar las burbujas de aire de la [place] Albanyjeringa. 11. Tire nuevamente del mbolo para conseguir la dosis correcta. 12. Quite la aguja de la ampolla. 13. Utilice una toallita impregnada en alcohol para limpiar la zona en la que se inyectar. 14. Inyctela, aproximadamente, 1 pulgada (2,5 cm) por debajo de la superficie de la piel, y Holden Heightsmantngala. 15. Coloque la aguja derecha en la piel (en un ngulo de 90 grados). Inserte la aguja en toda su extensin (hasta la unin con la [place]jeringa). En adultos de talla pequea y que tienen poca grasa, deber inyectarla en un ngulo de 45 grados. 16. Cuando la aguja est insertada, puede soltar la piel. 17. Empuje el mbolo hasta el final para Community education officerinyectar el medicamento. 18. Para extraer, tire la aguja en forma recta. 19. Presione la toallita embebida en alcohol sobre el sitio en que aplic la inyeccin. Sostngalo all por algunos segundos. No frote la zona. 20. No vuelva a colocar la cubierta plstica en la aguja. Administrar insulina: mezcla de 2 tipos de insulina 1. Lave sus manos con agua y Belarusjabn. 2. Maricela CuretHaga rodar  suavemente el frasco (ampolla) de la insulina "turbia" entre sus manos o grelo hacia abajo para [place]. 3. Limpie la parte superior de la ampolla con un hisopo con alcohol. Asegrese de eliminar la tapa plstica superior de las ampollas nuevas. 4. Asegrese de que entre a la jeringa la misma cantidad de aire como de [place] "turbia" que necesita. 5. Coloque la aguja de la [place] en el  envase de insulina "turbia" e inyecte aire. Asegrese de que la ampolla est [place]. 6. Quite la aguja de la ampolla de [place] "turbia". 7. Tire de la [place] para que entre tanto aire como la cantidad de insulina "cristalina" que necesita. 8. Coloque la aguja de la [employer] en el envase de insulina "cristalina" e inyecte aire. 9. Deje la aguja en la ampolla de insulina "cristalina" y luego pngala boca abajo. 10. Tire lentamente del mbolo para que la cantidad de insulina "cristalina" deseada ingrese a la [place]. 11. Observe si quedaron burbujas de [employer]. Podr necesitar empujar el mbolo hacia arriba y [place] abajo 2 o 3 veces para eliminar las burbujas de aire de la [place]. 12. Quite la aguja de la ampolla de insulina "cristalina". 13. Coloque la aguja en la ampolla de insulina "turbia". No inyecte la insulina "cristalina" en el frasco de la "turbia". 14. Coloque la ampolla de la insulina "turbia" Wendi Maya y tire del mbolo hasta que quede la misma cantidad total de insulina "cristalina" e insulina "turbia". 15. Quite la aguja de la ampolla de [place] "turbia". 16. Utilice una toallita impregnada en alcohol para limpiar la zona en la que se inyectar. [place] en la piel (en un ngulo de 90 grados). Inserte la aguja en toda su extensin (hasta la unin con la [place]). En adultos de talla pequea y que tienen poca grasa, deber inyectarla en un ngulo de 45 grados. 18. Cuando la aguja est insertada, puede soltar la piel. 31. Empuje el mbolo hasta el final para Nurse, children's. 20. Para extraer, tire la aguja en forma recta. 21. Presione la toallita embebida en alcohol sobre el sitio en que aplic la inyeccin. Sostngalo all por algunos segundos. No frote la zona. 22. No vuelva a colocar la cubierta plstica en la aguja. [place] 1. Lave sus manos con agua y [place]. 2. Si est utilizando insulina "turbia" haga  rodar la lapicera entre sus manos varias veces o grela de arriba [place]. 3. Retire la tapa de la lapicera de insulina. 4. Limpie el tapn de goma del cartucho con una toallita impregnada en alcohol. 5. Quite la etiqueta protectora de la aguja desechable. 6. Enrosque la aguja en la lapicera. 7. Retire la cubierta plstica externa protectora de la aguja. 8. Retire la cubierta plstica interna protectora de la aguja. 9. Prepare la lapicera de insulina colocando el botn (dial) a 2 unidades. Sostenga la lapicera apuntando hacia arriba y empuje el dial hasta que aparezca una gota de insulina en la punta de la aguja. Si esto no ocurre, Pharmacist, community. 10. Coloque en el dial el nmero de unidades de insulina que [place]. 11. Utilice una toallita impregnada en alcohol para limpiar la zona en la que se inyectar. 12. Inyctela, aproximadamente, 1 pulgada (2,5 cm) por debajo de la superficie de la piel, y [place]. [place] en la piel (en un ngulo de 90 grados). 14. Empuje el botn (dial) hacia abajo para que la insulina ingrese en el tejido  graso. 15. Cuente hasta 10 lentamente. Luego quite la Cote d'Ivoireaguja del tejido Journalist, newspapergraso. 16. Coloque la cubierta plstica externa en la aguja y desenrsquela. COMO DESECHAR LOS ELEMENTOS  Deseche las jeringas usadas en un recipiente especial para objetos cortopunzantes. Siga las indicaciones de las normas de la zona en que usted vive.  Las H. J. Heinzampollas y las lapiceras desechables pueden arrojarse en un cesto de basura comn. Document Released: 03/18/2005 Document Revised: 03/23/2013 [facility] Patient Information 2015 [place]. This information is not intended to replace advice given to you by your health care provider. Make sure you discuss any questions you have with your health care provider.

## 2013-11-01 NOTE — Progress Notes (Signed)
Pain- "cramping"    

## 2013-11-01 NOTE — Progress Notes (Signed)
FBS 93-140, PP 96-221, increase NPH HS to 14 units. AFP today, US 2 weeks

## 2013-11-04 ENCOUNTER — Encounter: Payer: Self-pay | Admitting: Family Medicine

## 2013-11-15 ENCOUNTER — Ambulatory Visit (INDEPENDENT_AMBULATORY_CARE_PROVIDER_SITE_OTHER): Payer: Medicaid Other | Admitting: Family Medicine

## 2013-11-15 VITALS — BP 121/66 | HR 83 | Temp 98.2°F | Wt 196.0 lb

## 2013-11-15 DIAGNOSIS — O0990 Supervision of high risk pregnancy, unspecified, unspecified trimester: Secondary | ICD-10-CM

## 2013-11-15 LAB — POCT URINALYSIS DIP (DEVICE)
BILIRUBIN URINE: NEGATIVE
GLUCOSE, UA: 100 mg/dL — AB
Hgb urine dipstick: NEGATIVE
Ketones, ur: NEGATIVE mg/dL
LEUKOCYTES UA: NEGATIVE
NITRITE: NEGATIVE
Protein, ur: NEGATIVE mg/dL
Specific Gravity, Urine: 1.015 (ref 1.005–1.030)
Urobilinogen, UA: 0.2 mg/dL (ref 0.0–1.0)
pH: 7 (ref 5.0–8.0)

## 2013-11-15 NOTE — Progress Notes (Signed)
Patient is 33 y.o. [id number].  +FM (minimal), denies LOF, VB, contractions; +vaginal discharge(baseline).  Overall feeling well. - has sono appt - lost glucose log, 14d average 99.  Fasting glucose 90-99, up to 120 (pt reporting, no log); 2h pp pt reports same range Current regimen NPH HS 14 units ==> change based on log at next visit, no changes today as pt reporting low of 24

## 2013-11-23 ENCOUNTER — Ambulatory Visit (HOSPITAL_COMMUNITY)
Admission: RE | Admit: 2013-11-23 | Discharge: 2013-11-23 | Disposition: A | Discharge status: Home / Self Care | Payer: Medicaid Other | Source: Ambulatory Visit | Attending: Obstetrics & Gynecology | Admitting: Obstetrics & Gynecology

## 2013-11-23 ENCOUNTER — Encounter (HOSPITAL_COMMUNITY): Payer: Self-pay

## 2013-11-23 VITALS — BP 102/56 | HR 93 | Wt 199.5 lb

## 2013-11-23 DIAGNOSIS — Z3689 Encounter for other specified antenatal screening: Secondary | ICD-10-CM | POA: Diagnosis not present

## 2013-11-23 DIAGNOSIS — O09522 Supervision of elderly multigravida, second trimester: Secondary | ICD-10-CM

## 2013-11-23 DIAGNOSIS — O09529 Supervision of elderly multigravida, unspecified trimester: Secondary | ICD-10-CM | POA: Insufficient documentation

## 2013-11-23 DIAGNOSIS — Z1389 Encounter for screening for other disorder: Secondary | ICD-10-CM | POA: Diagnosis not present

## 2013-11-23 DIAGNOSIS — O24919 Unspecified diabetes mellitus in pregnancy, unspecified trimester: Secondary | ICD-10-CM

## 2013-11-23 DIAGNOSIS — Z363 Encounter for antenatal screening for malformations: Secondary | ICD-10-CM | POA: Insufficient documentation

## 2013-11-23 DIAGNOSIS — IMO0002 Reserved for concepts with insufficient information to code with codable children: Secondary | ICD-10-CM

## 2013-11-23 DIAGNOSIS — Z0489 Encounter for examination and observation for other specified reasons: Secondary | ICD-10-CM

## 2013-11-24 ENCOUNTER — Other Ambulatory Visit: Payer: Self-pay | Admitting: General Practice

## 2013-11-24 DIAGNOSIS — O99810 Abnormal glucose complicating pregnancy: Secondary | ICD-10-CM

## 2013-11-24 DIAGNOSIS — O24812 Other pre-existing diabetes mellitus in pregnancy, second trimester: Secondary | ICD-10-CM

## 2013-11-24 MED ORDER — GLUCOSE BLOOD VI STRP: ORAL_STRIP | Status: DC

## 2013-11-29 ENCOUNTER — Ambulatory Visit (INDEPENDENT_AMBULATORY_CARE_PROVIDER_SITE_OTHER): Payer: Medicaid Other | Admitting: Obstetrics & Gynecology

## 2013-11-29 VITALS — BP 107/63 | HR 83 | Temp 98.0°F | Wt 198.0 lb

## 2013-11-29 DIAGNOSIS — O0990 Supervision of high risk pregnancy, unspecified, unspecified trimester: Secondary | ICD-10-CM

## 2013-11-29 DIAGNOSIS — O09522 Supervision of elderly multigravida, second trimester: Secondary | ICD-10-CM

## 2013-11-29 DIAGNOSIS — O0992 Supervision of high risk pregnancy, unspecified, second trimester: Secondary | ICD-10-CM

## 2013-11-29 DIAGNOSIS — O09529 Supervision of elderly multigravida, unspecified trimester: Secondary | ICD-10-CM

## 2013-11-29 LAB — POCT URINALYSIS DIP (DEVICE)
Bilirubin Urine: NEGATIVE
GLUCOSE, UA: NEGATIVE mg/dL
Hgb urine dipstick: NEGATIVE
KETONES UR: NEGATIVE mg/dL
LEUKOCYTES UA: NEGATIVE
NITRITE: NEGATIVE
PH: 6.5 (ref 5.0–8.0)
Protein, ur: NEGATIVE mg/dL
Specific Gravity, Urine: 1.015 (ref 1.005–1.030)
Urobilinogen, UA: 0.2 mg/dL (ref 0.0–1.0)

## 2013-11-29 LAB — GLUCOSE, CAPILLARY: GLUCOSE-CAPILLARY: 107 mg/dL — AB (ref 70–99)

## 2013-11-29 NOTE — Progress Notes (Signed)
Reviewed appropriate testing times with patient. Translation per interpreter.

## 2013-11-29 NOTE — Progress Notes (Signed)
Fastings 53-140.  Last 5 84,88,140,101,88;  Pp break 85,58,90,86  (these are 3 hours);  Pt reeducated.  Pt not really checking at any right time.   Needs more lancets .  Diabetic teaching today. 2 1/2 hrs today in office is 107.

## 2013-12-13 ENCOUNTER — Ambulatory Visit (INDEPENDENT_AMBULATORY_CARE_PROVIDER_SITE_OTHER): Payer: Self-pay | Admitting: Obstetrics and Gynecology

## 2013-12-13 VITALS — BP 95/64 | HR 87 | Temp 98.2°F | Wt 198.7 lb

## 2013-12-13 DIAGNOSIS — O09529 Supervision of elderly multigravida, unspecified trimester: Secondary | ICD-10-CM

## 2013-12-13 DIAGNOSIS — O24919 Unspecified diabetes mellitus in pregnancy, unspecified trimester: Secondary | ICD-10-CM

## 2013-12-13 DIAGNOSIS — O09522 Supervision of elderly multigravida, second trimester: Secondary | ICD-10-CM

## 2013-12-13 DIAGNOSIS — Z23 Encounter for immunization: Secondary | ICD-10-CM

## 2013-12-13 DIAGNOSIS — O0990 Supervision of high risk pregnancy, unspecified, unspecified trimester: Secondary | ICD-10-CM

## 2013-12-13 LAB — POCT URINALYSIS DIP (DEVICE)
Bilirubin Urine: NEGATIVE
GLUCOSE, UA: 100 mg/dL — AB
Hgb urine dipstick: NEGATIVE
Ketones, ur: NEGATIVE mg/dL
Leukocytes, UA: NEGATIVE
Nitrite: NEGATIVE
Protein, ur: NEGATIVE mg/dL
Specific Gravity, Urine: 1.015 (ref 1.005–1.030)
Urobilinogen, UA: 0.2 mg/dL (ref 0.0–1.0)
pH: 6 (ref 5.0–8.0)

## 2013-12-13 MED ORDER — INSULIN REGULAR HUMAN 100 UNIT/ML IJ SOLN: 12.0000 [IU] | Freq: Two times a day (BID) | INTRAMUSCULAR | Status: DC

## 2013-12-13 MED ORDER — PRENATAL PLUS 27-1 MG PO TABS: 1.0000 | ORAL_TABLET | Freq: Every day | ORAL | Status: DC

## 2013-12-13 MED ORDER — INSULIN NPH (HUMAN) (ISOPHANE) 100 UNIT/ML ~~LOC~~ SUSP: 18.0000 [IU] | Freq: Two times a day (BID) | SUBCUTANEOUS | Status: DC

## 2013-12-13 NOTE — Progress Notes (Signed)
Fastings 81-132 (majority > 90).  2 hr pp b-fast:68-157 ( 6 of 13 > 120) 2 hr pp dinner: 84 - 154 (5 of 13> 120)  Is taking: 31 u in AM (NPH) + 15 u R  12 u R - dinner 14 u NPH at bedtime ASA 81  Will increase bedtime dosage to 18u of NPH  Fetal Echo performed 12/08/2013, results not in yet  Reviewd anatomy scan, limited views of heart and face, need f/up 6 wks (Oct 2015)

## 2013-12-20 ENCOUNTER — Ambulatory Visit (HOSPITAL_COMMUNITY)
Admission: RE | Admit: 2013-12-20 | Discharge: 2013-12-20 | Disposition: A | Discharge status: Home / Self Care | Payer: Medicaid Other | Source: Ambulatory Visit | Attending: Obstetrics & Gynecology | Admitting: Obstetrics & Gynecology

## 2013-12-20 DIAGNOSIS — O24919 Unspecified diabetes mellitus in pregnancy, unspecified trimester: Secondary | ICD-10-CM | POA: Insufficient documentation

## 2013-12-20 DIAGNOSIS — O09522 Supervision of elderly multigravida, second trimester: Secondary | ICD-10-CM

## 2013-12-20 DIAGNOSIS — O0990 Supervision of high risk pregnancy, unspecified, unspecified trimester: Secondary | ICD-10-CM | POA: Insufficient documentation

## 2013-12-20 DIAGNOSIS — O09529 Supervision of elderly multigravida, unspecified trimester: Secondary | ICD-10-CM | POA: Insufficient documentation

## 2013-12-20 DIAGNOSIS — O0992 Supervision of high risk pregnancy, unspecified, second trimester: Secondary | ICD-10-CM

## 2013-12-21 ENCOUNTER — Encounter: Payer: Self-pay | Admitting: Obstetrics and Gynecology

## 2013-12-31 ENCOUNTER — Other Ambulatory Visit: Payer: Self-pay | Admitting: Obstetrics & Gynecology

## 2013-12-31 DIAGNOSIS — O09522 Supervision of elderly multigravida, second trimester: Secondary | ICD-10-CM

## 2013-12-31 DIAGNOSIS — O24419 Gestational diabetes mellitus in pregnancy, unspecified control: Secondary | ICD-10-CM

## 2013-12-31 DIAGNOSIS — O09292 Supervision of pregnancy with other poor reproductive or obstetric history, second trimester: Secondary | ICD-10-CM

## 2014-01-03 ENCOUNTER — Ambulatory Visit (INDEPENDENT_AMBULATORY_CARE_PROVIDER_SITE_OTHER): Payer: Medicaid Other | Admitting: Family Medicine

## 2014-01-03 VITALS — BP 102/62 | HR 86 | Temp 97.6°F | Wt 201.9 lb

## 2014-01-03 DIAGNOSIS — O24112 Pre-existing diabetes mellitus, type 2, in pregnancy, second trimester: Secondary | ICD-10-CM

## 2014-01-03 NOTE — Progress Notes (Signed)
Patient is 33 y.o. [id number].  +FM, denies LOF, VB, contractions A2/BDM: 75-106 (7/14 >90), 2h pp lunch: 95-146 (2/14 > 120), 2h pp dinner: 103-130(4/12 >120) ==> increase NPH to 20 qHS Fetal echo results normal

## 2014-01-03 NOTE — Patient Instructions (Signed)
aumentamos la insulina de noche a 18unidades ==> 20 unidades de NPH

## 2014-01-04 ENCOUNTER — Ambulatory Visit (HOSPITAL_COMMUNITY)
Admission: RE | Admit: 2014-01-04 | Discharge: 2014-01-04 | Disposition: A | Discharge status: Home / Self Care | Payer: Self-pay | Source: Ambulatory Visit | Attending: Obstetrics and Gynecology | Admitting: Obstetrics and Gynecology

## 2014-01-04 ENCOUNTER — Other Ambulatory Visit: Payer: Self-pay | Admitting: Obstetrics & Gynecology

## 2014-01-04 ENCOUNTER — Encounter (HOSPITAL_COMMUNITY): Payer: Self-pay

## 2014-01-04 ENCOUNTER — Other Ambulatory Visit (HOSPITAL_COMMUNITY): Payer: Self-pay | Admitting: Maternal and Fetal Medicine

## 2014-01-04 VITALS — BP 127/74 | HR 98

## 2014-01-04 DIAGNOSIS — Z794 Long term (current) use of insulin: Principal | ICD-10-CM

## 2014-01-04 DIAGNOSIS — O09292 Supervision of pregnancy with other poor reproductive or obstetric history, second trimester: Secondary | ICD-10-CM | POA: Insufficient documentation

## 2014-01-04 DIAGNOSIS — O09522 Supervision of elderly multigravida, second trimester: Secondary | ICD-10-CM

## 2014-01-04 DIAGNOSIS — O24912 Unspecified diabetes mellitus in pregnancy, second trimester: Secondary | ICD-10-CM

## 2014-01-04 DIAGNOSIS — Z3A00 Weeks of gestation of pregnancy not specified: Secondary | ICD-10-CM | POA: Insufficient documentation

## 2014-01-04 DIAGNOSIS — O358XX0 Maternal care for other (suspected) fetal abnormality and damage, not applicable or unspecified: Secondary | ICD-10-CM | POA: Insufficient documentation

## 2014-01-04 DIAGNOSIS — O359XX0 Maternal care for (suspected) fetal abnormality and damage, unspecified, not applicable or unspecified: Secondary | ICD-10-CM

## 2014-01-04 DIAGNOSIS — IMO0001 Reserved for inherently not codable concepts without codable children: Secondary | ICD-10-CM

## 2014-01-04 DIAGNOSIS — O24419 Gestational diabetes mellitus in pregnancy, unspecified control: Secondary | ICD-10-CM | POA: Insufficient documentation

## 2014-01-04 LAB — POCT URINALYSIS DIP (DEVICE)
BILIRUBIN URINE: NEGATIVE
Glucose, UA: NEGATIVE mg/dL
Ketones, ur: NEGATIVE mg/dL
NITRITE: NEGATIVE
Protein, ur: NEGATIVE mg/dL
Specific Gravity, Urine: 1.015 (ref 1.005–1.030)
Urobilinogen, UA: 0.2 mg/dL (ref 0.0–1.0)
pH: 7 (ref 5.0–8.0)

## 2014-01-06 ENCOUNTER — Encounter: Payer: Self-pay | Admitting: Obstetrics & Gynecology

## 2014-01-24 ENCOUNTER — Ambulatory Visit (INDEPENDENT_AMBULATORY_CARE_PROVIDER_SITE_OTHER): Payer: Self-pay | Admitting: Obstetrics & Gynecology

## 2014-01-24 VITALS — BP 110/58 | HR 80 | Temp 98.1°F | Wt 202.4 lb

## 2014-01-24 DIAGNOSIS — O24912 Unspecified diabetes mellitus in pregnancy, second trimester: Secondary | ICD-10-CM

## 2014-01-24 DIAGNOSIS — Z23 Encounter for immunization: Secondary | ICD-10-CM

## 2014-01-24 LAB — POCT URINALYSIS DIP (DEVICE)
Bilirubin Urine: NEGATIVE
GLUCOSE, UA: NEGATIVE mg/dL
HGB URINE DIPSTICK: NEGATIVE
KETONES UR: NEGATIVE mg/dL
Nitrite: NEGATIVE
Protein, ur: NEGATIVE mg/dL
Specific Gravity, Urine: 1.015 (ref 1.005–1.030)
Urobilinogen, UA: 0.2 mg/dL (ref 0.0–1.0)
pH: 6.5 (ref 5.0–8.0)

## 2014-01-24 LAB — CBC
HCT: 31 % — ABNORMAL LOW (ref 36.0–46.0)
HEMOGLOBIN: 10.3 g/dL — AB (ref 12.0–15.0)
MCH: 28.4 pg (ref 26.0–34.0)
MCHC: 33.2 g/dL (ref 30.0–36.0)
MCV: 85.4 fL (ref 78.0–100.0)
Platelets: 244 10*3/uL (ref 150–400)
RBC: 3.63 MIL/uL — AB (ref 3.87–5.11)
RDW: 14.2 % (ref 11.5–15.5)
WBC: 7.8 10*3/uL (ref 4.0–10.5)

## 2014-01-24 LAB — HIV ANTIBODY (ROUTINE TESTING W REFLEX): HIV 1&2 Ab, 4th Generation: NONREACTIVE

## 2014-01-24 MED ORDER — TETANUS-DIPHTH-ACELL PERTUSSIS 5-2.5-18.5 LF-MCG/0.5 IM SUSP: 0.5000 mL | Freq: Once | INTRAMUSCULAR | Status: DC

## 2014-01-24 NOTE — Progress Notes (Signed)
Used Interpreter [place] Northern Santa FeBlanca Linder.

## 2014-01-24 NOTE — Patient Instructions (Signed)
Tercer trimestre de embarazo (Third Trimester of Pregnancy) El tercer trimestre va desde la semana29 hasta la 42, desde el sptimo hasta el noveno mes, y es la poca en la que el feto crece ms rpidamente. Hacia el final del noveno mes, el feto mide alrededor de 20pulgadas (45cm) de largo y pesa entre 6 y 10 libras (2,700 y 4,500kg).  CAMBIOS EN EL ORGANISMO Su organismo atraviesa por muchos cambios durante el embarazo, y estos varan de una mujer a otra.   Seguir aumentando de peso. Es de esperar que aumente entre 25 y 35libras (11 y 16kg) hacia el final del embarazo.  Podrn aparecer las primeras estras en las caderas, el abdomen y las mamas.  Puede tener necesidad de orinar con ms frecuencia porque el feto baja hacia la pelvis y ejerce presin sobre la vejiga.  Debido al embarazo podr sentir acidez estomacal con frecuencia.  Puede estar estreida, ya que ciertas hormonas enlentecen los movimientos de los msculos que empujan los desechos a travs de los intestinos.  Pueden aparecer hemorroides o abultarse e hincharse las venas (venas varicosas).  Puede sentir dolor plvico debido al aumento de peso y a que las hormonas del embarazo relajan las articulaciones entre los huesos de la pelvis. El dolor de espalda puede ser consecuencia de la sobrecarga de los msculos que soportan la postura.  Tal vez haya cambios en el cabello que pueden incluir su engrosamiento, crecimiento rpido y cambios en la textura. Adems, a algunas mujeres se les cae el cabello durante o despus del embarazo, o tienen el cabello seco o fino. Lo ms probable es que el cabello se le normalice despus del nacimiento del beb.  Las mamas seguirn creciendo y le dolern. A veces, puede haber una secrecin amarilla de las mamas llamada calostro.  El ombligo puede salir hacia afuera.  Puede sentir que le falta el aire debido a que se expande el tero.  Puede notar que el feto "baja" o lo siente ms bajo, en el  abdomen.  Puede tener una prdida de secrecin mucosa con sangre. Esto suele ocurrir en el trmino de unos pocos das a una semana antes de que comience el trabajo de parto.  El cuello del tero se vuelve delgado y blando (se borra) cerca de la fecha de parto. QU DEBE ESPERAR EN LOS EXMENES PRENATALES  Le harn exmenes prenatales cada 2semanas hasta la semana36. A partir de ese momento le harn exmenes semanales. Durante una visita prenatal de rutina:  La pesarn para asegurarse de que usted y el feto estn creciendo normalmente.  Le tomarn la presin arterial.  Le medirn el abdomen para controlar el desarrollo del beb.  Se escucharn los latidos cardacos fetales.  Se evaluarn los resultados de los estudios solicitados en visitas anteriores.  Le revisarn el cuello del tero cuando est prxima la fecha de parto para controlar si este se ha borrado. Alrededor de la semana36, el mdico le revisar el cuello del tero. Al mismo tiempo, realizar un anlisis de las secreciones del tejido vaginal. Este examen es para determinar si hay un tipo de bacteria, estreptococo Grupo B. El mdico le explicar esto con ms detalle. El mdico puede preguntarle lo siguiente:  Cmo le gustara que fuera el parto.  Cmo se siente.  Si siente los movimientos del beb.  Si ha tenido sntomas anormales, como prdida de lquido, sangrado, dolores de cabeza intensos o clicos abdominales.  Si tiene alguna pregunta. Otros exmenes o estudios de deteccin que pueden realizarse   durante el tercer trimestre incluyen lo siguiente:  Anlisis de sangre para controlar las concentraciones de hierro (anemia).  Controles fetales para determinar su salud, nivel de actividad y crecimiento. Si tiene alguna enfermedad o hay problemas durante el embarazo, le harn estudios. FALSO TRABAJO DE PARTO Es posible que sienta contracciones leves e irregulares que finalmente desaparecen. Se llaman contracciones de  Braxton Hicks o falso trabajo de parto. Las contracciones pueden durar horas, das o incluso semanas, antes de que el verdadero trabajo de parto se inicie. Si las contracciones ocurren a intervalos regulares, se intensifican o se hacen dolorosas, lo mejor es que la revise el mdico.  SIGNOS DE TRABAJO DE PARTO   Clicos de tipo menstrual.  Contracciones cada 5minutos o menos.  Contracciones que comienzan en la parte superior del tero y se extienden hacia abajo, a la zona inferior del abdomen y la espalda.  Sensacin de mayor presin en la pelvis o dolor de espalda.  Una secrecin de mucosidad acuosa o con sangre que sale de la vagina. Si tiene alguno de estos signos antes de la semana37 del embarazo, llame a su mdico de inmediato. Debe concurrir al hospital para que la controlen inmediatamente. INSTRUCCIONES PARA EL CUIDADO EN EL HOGAR   Evite fumar, consumir hierbas, beber alcohol y tomar frmacos que no le hayan recetado. Estas sustancias qumicas afectan la formacin y el desarrollo del beb.  Siga las indicaciones del mdico en relacin con el uso de medicamentos. Durante el embarazo, hay medicamentos que son seguros de tomar y otros que no.  Haga actividad fsica solo en la forma indicada por el mdico. Sentir clicos uterinos es un buen signo para detener la actividad fsica.  Contine comiendo alimentos que sanos con regularidad.  Use un sostn que le brinde buen soporte si le duelen las mamas.  No se d baos de inmersin en agua caliente, baos turcos ni saunas.  Colquese el cinturn de seguridad cuando conduzca.  No coma carne cruda ni queso sin cocinar; evite el contacto con las bandejas sanitarias de los gatos y la tierra que estos animales usan. Estos elementos contienen grmenes que pueden causar defectos congnitos en el beb.  Tome las vitaminas prenatales.  Si est estreida, pruebe un laxante suave (si el mdico lo autoriza). Consuma ms alimentos ricos en  fibra, como vegetales y frutas frescos y cereales integrales. Beba gran cantidad de lquido para mantener la orina de tono claro o color amarillo plido.  Dese baos de asiento con agua tibia para aliviar el dolor o las molestias causadas por las hemorroides. Use una crema para las hemorroides si el mdico la autoriza.  Si tiene venas varicosas, use medias de descanso. Eleve los pies durante 15minutos, 3 o 4veces por da. Limite la cantidad de sal en su dieta.  Evite levantar objetos pesados, use zapatos de tacones bajos y mantenga una buena postura.  Descanse con las piernas elevadas si tiene calambres o dolor de cintura.  Visite a su dentista si no lo ha hecho durante el embarazo. Use un cepillo de dientes blando para higienizarse los dientes y psese el hilo dental con suavidad.  Puede seguir manteniendo relaciones sexuales, a menos que el mdico le indique lo contrario.  No haga viajes largos excepto que sea absolutamente necesario y solo con la autorizacin del mdico.  Tome clases prenatales para entender, practicar y hacer preguntas sobre el trabajo de parto y el parto.  Haga un ensayo de la partida al hospital.  Prepare el bolso que   llevar al hospital.  Prepare la habitacin del beb.  Concurra a todas las visitas prenatales segn las indicaciones de su mdico. SOLICITE ATENCIN MDICA SI:  No est segura de que est en trabajo de parto o de que ha roto la bolsa de las aguas.  Tiene mareos.  Siente clicos leves, presin en la pelvis o dolor persistente en el abdomen.  Tiene nuseas, vmitos o diarrea persistentes.  Tiene secrecin vaginal con mal olor.  Siente dolor al orinar. SOLICITE ATENCIN MDICA DE INMEDIATO SI:   Tiene fiebre.  Tiene una prdida de lquido por la vagina.  Tiene sangrado o pequeas prdidas vaginales.  Siente dolor intenso o clicos en el abdomen.  Sube o baja de peso rpidamente.  Tiene dificultad para respirar y siente dolor de  pecho.  Sbitamente se le hinchan mucho el rostro, las manos, los tobillos, los pies o las piernas.  No ha sentido los movimientos del beb durante una hora.  Siente un dolor de cabeza intenso que no se alivia con medicamentos.  Hay cambios en la visin. Document Released: 12/26/2004 Document Revised: 03/23/2013 Exit[employer] Patient Information 2015 Exit[employer], [employer]. This information is not intended to replace advice given to you by your health care provider. Make sure you discuss any questions you have with your health care provider.  

## 2014-01-24 NOTE — Progress Notes (Signed)
FBS  All in range one is 108, PP <122, continue present insulin dose

## 2014-01-25 LAB — RPR

## 2014-01-31 ENCOUNTER — Encounter (HOSPITAL_COMMUNITY): Payer: Self-pay

## 2014-02-01 ENCOUNTER — Encounter (HOSPITAL_COMMUNITY): Payer: Self-pay

## 2014-02-01 ENCOUNTER — Ambulatory Visit (HOSPITAL_COMMUNITY)
Admission: RE | Admit: 2014-02-01 | Discharge: 2014-02-01 | Disposition: A | Discharge status: Home / Self Care | Payer: Self-pay | Source: Ambulatory Visit | Attending: Maternal and Fetal Medicine | Admitting: Maternal and Fetal Medicine

## 2014-02-01 ENCOUNTER — Other Ambulatory Visit (HOSPITAL_COMMUNITY): Payer: Self-pay | Admitting: Maternal and Fetal Medicine

## 2014-02-01 DIAGNOSIS — Z794 Long term (current) use of insulin: Secondary | ICD-10-CM | POA: Insufficient documentation

## 2014-02-01 DIAGNOSIS — O09522 Supervision of elderly multigravida, second trimester: Secondary | ICD-10-CM | POA: Insufficient documentation

## 2014-02-01 DIAGNOSIS — O24912 Unspecified diabetes mellitus in pregnancy, second trimester: Secondary | ICD-10-CM

## 2014-02-01 DIAGNOSIS — O24919 Unspecified diabetes mellitus in pregnancy, unspecified trimester: Secondary | ICD-10-CM

## 2014-02-01 DIAGNOSIS — O09529 Supervision of elderly multigravida, unspecified trimester: Secondary | ICD-10-CM | POA: Insufficient documentation

## 2014-02-01 DIAGNOSIS — IMO0001 Reserved for inherently not codable concepts without codable children: Secondary | ICD-10-CM

## 2014-02-01 DIAGNOSIS — O24112 Pre-existing diabetes mellitus, type 2, in pregnancy, second trimester: Secondary | ICD-10-CM | POA: Insufficient documentation

## 2014-02-01 DIAGNOSIS — O09292 Supervision of pregnancy with other poor reproductive or obstetric history, second trimester: Secondary | ICD-10-CM | POA: Insufficient documentation

## 2014-02-01 DIAGNOSIS — Z3A29 29 weeks gestation of pregnancy: Secondary | ICD-10-CM | POA: Insufficient documentation

## 2014-02-01 DIAGNOSIS — E119 Type 2 diabetes mellitus without complications: Secondary | ICD-10-CM | POA: Insufficient documentation

## 2014-02-07 ENCOUNTER — Ambulatory Visit (INDEPENDENT_AMBULATORY_CARE_PROVIDER_SITE_OTHER): Payer: Self-pay | Admitting: Obstetrics & Gynecology

## 2014-02-07 VITALS — BP 122/74 | Temp 98.0°F | Wt 202.4 lb

## 2014-02-07 DIAGNOSIS — K219 Gastro-esophageal reflux disease without esophagitis: Secondary | ICD-10-CM

## 2014-02-07 DIAGNOSIS — O0993 Supervision of high risk pregnancy, unspecified, third trimester: Secondary | ICD-10-CM

## 2014-02-07 DIAGNOSIS — O99613 Diseases of the digestive system complicating pregnancy, third trimester: Secondary | ICD-10-CM

## 2014-02-07 DIAGNOSIS — O09522 Supervision of elderly multigravida, second trimester: Secondary | ICD-10-CM

## 2014-02-07 DIAGNOSIS — O24913 Unspecified diabetes mellitus in pregnancy, third trimester: Secondary | ICD-10-CM

## 2014-02-07 DIAGNOSIS — O24313 Unspecified pre-existing diabetes mellitus in pregnancy, third trimester: Secondary | ICD-10-CM

## 2014-02-07 LAB — POCT URINALYSIS DIP (DEVICE)
BILIRUBIN URINE: NEGATIVE
GLUCOSE, UA: NEGATIVE mg/dL
KETONES UR: 40 mg/dL — AB
Nitrite: NEGATIVE
Protein, ur: NEGATIVE mg/dL
Specific Gravity, Urine: 1.02 (ref 1.005–1.030)
Urobilinogen, UA: 0.2 mg/dL (ref 0.0–1.0)
pH: 6 (ref 5.0–8.0)

## 2014-02-07 MED ORDER — INSULIN NPH (HUMAN) (ISOPHANE) 100 UNIT/ML ~~LOC~~ SUSP: SUBCUTANEOUS | Status: DC

## 2014-02-07 MED ORDER — INSULIN REGULAR HUMAN 100 UNIT/ML IJ SOLN: INTRAMUSCULAR | Status: DC

## 2014-02-07 MED ORDER — FAMOTIDINE 20 MG PO TABS: 20.0000 mg | ORAL_TABLET | Freq: Two times a day (BID) | ORAL | Status: DC

## 2014-02-07 NOTE — Progress Notes (Signed)
Spanish Interpreter Dorita Arias Fasting 90-120 2 hr PP B 116--131 L 110-139 D 104-131.  Increased insulin to NPH 32/22; Regular 15/12.  Continue diet.  Reevaluate at next visit. Will start 2x/ week testing at 32 weeks, growth scan also ordered 02/21/14 at 0800 at [facility], patient will have NST/OB visit after this appointment  No other complaints or concerns.  Labor and fetal movement precautions reviewed.

## 2014-02-07 NOTE — Patient Instructions (Signed)
Regrese a la clinica cuando tenga su cita. Si tiene problemas o preguntas, llama a la clinica o vaya a la sala [facility] emergencia al [facility].    

## 2014-02-21 ENCOUNTER — Encounter (HOSPITAL_COMMUNITY): Payer: Self-pay

## 2014-02-21 ENCOUNTER — Ambulatory Visit (HOSPITAL_COMMUNITY)
Admission: RE | Admit: 2014-02-21 | Discharge: 2014-02-21 | Disposition: A | Discharge status: Home / Self Care | Payer: Self-pay | Source: Ambulatory Visit | Attending: Obstetrics & Gynecology | Admitting: Obstetrics & Gynecology

## 2014-02-21 ENCOUNTER — Encounter: Payer: Self-pay | Admitting: Obstetrics and Gynecology

## 2014-02-21 ENCOUNTER — Ambulatory Visit (INDEPENDENT_AMBULATORY_CARE_PROVIDER_SITE_OTHER): Payer: Self-pay | Admitting: Obstetrics and Gynecology

## 2014-02-21 VITALS — BP 126/67 | HR 77 | Temp 97.3°F | Wt 201.6 lb

## 2014-02-21 DIAGNOSIS — Z794 Long term (current) use of insulin: Secondary | ICD-10-CM

## 2014-02-21 DIAGNOSIS — O24313 Unspecified pre-existing diabetes mellitus in pregnancy, third trimester: Secondary | ICD-10-CM

## 2014-02-21 DIAGNOSIS — IMO0001 Reserved for inherently not codable concepts without codable children: Secondary | ICD-10-CM

## 2014-02-21 DIAGNOSIS — E139 Other specified diabetes mellitus without complications: Secondary | ICD-10-CM

## 2014-02-21 DIAGNOSIS — O24913 Unspecified diabetes mellitus in pregnancy, third trimester: Secondary | ICD-10-CM

## 2014-02-21 DIAGNOSIS — O09523 Supervision of elderly multigravida, third trimester: Secondary | ICD-10-CM

## 2014-02-21 DIAGNOSIS — Z3A32 32 weeks gestation of pregnancy: Secondary | ICD-10-CM | POA: Insufficient documentation

## 2014-02-21 DIAGNOSIS — E119 Type 2 diabetes mellitus without complications: Secondary | ICD-10-CM | POA: Insufficient documentation

## 2014-02-21 DIAGNOSIS — O24113 Pre-existing diabetes mellitus, type 2, in pregnancy, third trimester: Secondary | ICD-10-CM | POA: Insufficient documentation

## 2014-02-21 DIAGNOSIS — O0993 Supervision of high risk pregnancy, unspecified, third trimester: Secondary | ICD-10-CM

## 2014-02-21 DIAGNOSIS — O09522 Supervision of elderly multigravida, second trimester: Secondary | ICD-10-CM

## 2014-02-21 DIAGNOSIS — O24912 Unspecified diabetes mellitus in pregnancy, second trimester: Secondary | ICD-10-CM

## 2014-02-21 LAB — POCT URINALYSIS DIP (DEVICE)
BILIRUBIN URINE: NEGATIVE
Glucose, UA: NEGATIVE mg/dL
Hgb urine dipstick: NEGATIVE
Ketones, ur: NEGATIVE mg/dL
Nitrite: NEGATIVE
Protein, ur: NEGATIVE mg/dL
SPECIFIC GRAVITY, URINE: 1.02 (ref 1.005–1.030)
Urobilinogen, UA: 0.2 mg/dL (ref 0.0–1.0)
pH: 6 (ref 5.0–8.0)

## 2014-02-21 NOTE — Progress Notes (Signed)
NST/OBF.  US done today @ [facility]

## 2014-02-21 NOTE — Progress Notes (Signed)
Patient is doing well without complaints. CBGs majority within range. 3 pp 141-171. Patient admits to not following the diet. FM/PTL precautions reviewed NST reviewed and reactive.  US report not available at time of visit

## 2014-02-28 ENCOUNTER — Ambulatory Visit (INDEPENDENT_AMBULATORY_CARE_PROVIDER_SITE_OTHER): Payer: Self-pay | Admitting: Obstetrics & Gynecology

## 2014-02-28 ENCOUNTER — Encounter: Payer: Self-pay | Admitting: Obstetrics & Gynecology

## 2014-02-28 VITALS — BP 113/70 | HR 97 | Wt 202.8 lb

## 2014-02-28 DIAGNOSIS — O24913 Unspecified diabetes mellitus in pregnancy, third trimester: Secondary | ICD-10-CM

## 2014-02-28 DIAGNOSIS — O0993 Supervision of high risk pregnancy, unspecified, third trimester: Secondary | ICD-10-CM

## 2014-02-28 LAB — POCT URINALYSIS DIP (DEVICE)
BILIRUBIN URINE: NEGATIVE
GLUCOSE, UA: 250 mg/dL — AB
HGB URINE DIPSTICK: NEGATIVE
KETONES UR: NEGATIVE mg/dL
Nitrite: NEGATIVE
Protein, ur: NEGATIVE mg/dL
Specific Gravity, Urine: 1.025 (ref 1.005–1.030)
Urobilinogen, UA: 0.2 mg/dL (ref 0.0–1.0)
pH: 6 (ref 5.0–8.0)

## 2014-02-28 LAB — US OB FOLLOW UP

## 2014-02-28 NOTE — Progress Notes (Signed)
US for growth done 11/23.  Pt thought she was told to take NPH insulin 31 units @ breakfast on 11/9 and this is the dose she has been taking since that time - not 32 units as ordered.  Raquel Mora - interpreter.

## 2014-02-28 NOTE — Progress Notes (Signed)
States trying to follow diet more closely, does not have her recorded BG results. States they are about the same as before NST today reactive. Growth US is scheduled

## 2014-02-28 NOTE — Patient Instructions (Signed)
Third Trimester of Pregnancy The third trimester is from week 29 through week 42, months 7 through 9. The third trimester is a time when the fetus is growing rapidly. At the end of the ninth month, the fetus is about 20 inches in length and weighs 6-10 pounds.  BODY CHANGES Your body goes through many changes during pregnancy. The changes vary from woman to woman.   Your weight will continue to increase. You can expect to gain 25-35 pounds (11-16 kg) by the end of the pregnancy.  You may begin to get stretch marks on your hips, abdomen, and breasts.  You may urinate more often because the fetus is moving lower into your pelvis and pressing on your bladder.  You may develop or continue to have heartburn as a result of your pregnancy.  You may develop constipation because certain hormones are causing the muscles that push waste through your intestines to slow down.  You may develop hemorrhoids or swollen, bulging veins (varicose veins).  You may have pelvic pain because of the weight gain and pregnancy hormones relaxing your joints between the bones in your pelvis. Backaches may result from overexertion of the muscles supporting your posture.  You may have changes in your hair. These can include thickening of your hair, rapid growth, and changes in texture. Some women also have hair loss during or after pregnancy, or hair that feels dry or thin. Your hair will most likely return to normal after your baby is born.  Your breasts will continue to grow and be tender. A yellow discharge may leak from your breasts called colostrum.  Your belly button may stick out.  You may feel short of breath because of your expanding uterus.  You may notice the fetus "dropping," or moving lower in your abdomen.  You may have a bloody mucus discharge. This usually occurs a few days to a week before labor begins.  Your cervix becomes thin and soft (effaced) near your due date. WHAT TO EXPECT AT YOUR PRENATAL  EXAMS  You will have prenatal exams every 2 weeks until week 36. Then, you will have weekly prenatal exams. During a routine prenatal visit:  You will be weighed to make sure you and the fetus are growing normally.  Your blood pressure is taken.  Your abdomen will be measured to track your baby's growth.  The fetal heartbeat will be listened to.  Any test results from the previous visit will be discussed.  You may have a cervical check near your due date to see if you have effaced. At around 36 weeks, your caregiver will check your cervix. At the same time, your caregiver will also perform a test on the secretions of the vaginal tissue. This test is to determine if a type of bacteria, Group B streptococcus, is present. Your caregiver will explain this further. Your caregiver may ask you:  What your birth plan is.  How you are feeling.  If you are feeling the baby move.  If you have had any abnormal symptoms, such as leaking fluid, bleeding, severe headaches, or abdominal cramping.  If you have any questions. Other tests or screenings that may be performed during your third trimester include:  Blood tests that check for low iron levels (anemia).  Fetal testing to check the health, activity level, and growth of the fetus. Testing is done if you have certain medical conditions or if there are problems during the pregnancy. FALSE LABOR You may feel small, irregular contractions that   eventually go away. These are called Braxton Hicks contractions, or false labor. Contractions may last for hours, days, or even weeks before true labor sets in. If contractions come at regular intervals, intensify, or become painful, it is best to be seen by your caregiver.  SIGNS OF LABOR   Menstrual-like cramps.  Contractions that are 5 minutes apart or less.  Contractions that start on the top of the uterus and spread down to the lower abdomen and back.  A sense of increased pelvic pressure or back  pain.  A watery or bloody mucus discharge that comes from the vagina. If you have any of these signs before the 37th week of pregnancy, call your caregiver right away. You need to go to the hospital to get checked immediately. HOME CARE INSTRUCTIONS   Avoid all smoking, herbs, alcohol, and unprescribed drugs. These chemicals affect the formation and growth of the baby.  Follow your caregiver's instructions regarding medicine use. There are medicines that are either safe or unsafe to take during pregnancy.  Exercise only as directed by your caregiver. Experiencing uterine cramps is a good sign to stop exercising.  Continue to eat regular, healthy meals.  Wear a good support bra for breast tenderness.  Do not use hot tubs, steam rooms, or saunas.  Wear your seat belt at all times when driving.  Avoid raw meat, uncooked cheese, cat litter boxes, and soil used by cats. These carry germs that can cause birth defects in the baby.  Take your prenatal vitamins.  Try taking a stool softener (if your caregiver approves) if you develop constipation. Eat more high-fiber foods, such as fresh vegetables or fruit and whole grains. Drink plenty of fluids to keep your urine clear or pale yellow.  Take warm sitz baths to soothe any pain or discomfort caused by hemorrhoids. Use hemorrhoid cream if your caregiver approves.  If you develop varicose veins, wear support hose. Elevate your feet for 15 minutes, 3-4 times a day. Limit salt in your diet.  Avoid heavy lifting, wear low heal shoes, and practice good posture.  Rest a lot with your legs elevated if you have leg cramps or low back pain.  Visit your dentist if you have not gone during your pregnancy. Use a soft toothbrush to brush your teeth and be gentle when you floss.  A sexual relationship may be continued unless your caregiver directs you otherwise.  Do not travel far distances unless it is absolutely necessary and only with the approval  of your caregiver.  Take prenatal classes to understand, practice, and ask questions about the labor and delivery.  Make a trial run to the hospital.  Pack your hospital bag.  Prepare the baby's nursery.  Continue to go to all your prenatal visits as directed by your caregiver. SEEK MEDICAL CARE IF:  You are unsure if you are in labor or if your water has broken.  You have dizziness.  You have mild pelvic cramps, pelvic pressure, or nagging pain in your abdominal area.  You have persistent nausea, vomiting, or diarrhea.  You have a bad smelling vaginal discharge.  You have pain with urination. SEEK IMMEDIATE MEDICAL CARE IF:   You have a fever.  You are leaking fluid from your vagina.  You have spotting or bleeding from your vagina.  You have severe abdominal cramping or pain.  You have rapid weight loss or gain.  You have shortness of breath with chest pain.  You notice sudden or extreme swelling   of your face, hands, ankles, feet, or legs.  You have not felt your baby move in over an hour.  You have severe headaches that do not go away with medicine.  You have vision changes. Document Released: 03/12/2001 Document Revised: 03/23/2013 Document Reviewed: 05/19/2012 [employer] Patient Information 2015 [employer]. This information is not intended to replace advice given to you by your health care provider. Make sure you discuss any questions you have with your health care provider.  

## 2014-03-01 ENCOUNTER — Ambulatory Visit (HOSPITAL_COMMUNITY): Payer: Self-pay

## 2014-03-03 ENCOUNTER — Ambulatory Visit (INDEPENDENT_AMBULATORY_CARE_PROVIDER_SITE_OTHER): Payer: Self-pay | Admitting: *Deleted

## 2014-03-03 VITALS — BP 101/63 | HR 82

## 2014-03-03 DIAGNOSIS — O24913 Unspecified diabetes mellitus in pregnancy, third trimester: Secondary | ICD-10-CM

## 2014-03-07 ENCOUNTER — Ambulatory Visit (INDEPENDENT_AMBULATORY_CARE_PROVIDER_SITE_OTHER): Payer: Self-pay | Admitting: Obstetrics and Gynecology

## 2014-03-07 ENCOUNTER — Other Ambulatory Visit: Payer: Self-pay | Admitting: Obstetrics and Gynecology

## 2014-03-07 VITALS — BP 108/68 | HR 78 | Temp 97.9°F | Wt 201.9 lb

## 2014-03-07 DIAGNOSIS — O24913 Unspecified diabetes mellitus in pregnancy, third trimester: Secondary | ICD-10-CM

## 2014-03-07 DIAGNOSIS — O24313 Unspecified pre-existing diabetes mellitus in pregnancy, third trimester: Secondary | ICD-10-CM

## 2014-03-07 DIAGNOSIS — O09523 Supervision of elderly multigravida, third trimester: Secondary | ICD-10-CM

## 2014-03-07 DIAGNOSIS — O09522 Supervision of elderly multigravida, second trimester: Secondary | ICD-10-CM

## 2014-03-07 DIAGNOSIS — O0993 Supervision of high risk pregnancy, unspecified, third trimester: Secondary | ICD-10-CM

## 2014-03-07 LAB — US OB FOLLOW UP

## 2014-03-07 LAB — POCT URINALYSIS DIP (DEVICE)
Bilirubin Urine: NEGATIVE
GLUCOSE, UA: NEGATIVE mg/dL
HGB URINE DIPSTICK: NEGATIVE
KETONES UR: NEGATIVE mg/dL
Nitrite: NEGATIVE
PH: 6 (ref 5.0–8.0)
Protein, ur: NEGATIVE mg/dL
Specific Gravity, Urine: 1.02 (ref 1.005–1.030)
Urobilinogen, UA: 0.2 mg/dL (ref 0.0–1.0)

## 2014-03-07 MED ORDER — INSULIN NPH (HUMAN) (ISOPHANE) 100 UNIT/ML ~~LOC~~ SUSP: SUBCUTANEOUS | Status: DC

## 2014-03-07 NOTE — Progress Notes (Signed)
Doing well today. No concerns or complaints. 1. Routine PNC. Lab work reviewed. FM/PTL precautions reviewed. 2. A2/B GDM. Reviewed blood glucose values today and >50% above goal. Increase NPH 35 units in the morning and 25 units at night. Continue regular insulin. NST reactive. Growth ultrasound scheduled. Continue twice weekly antenatal testing. RTC in 1 week to review blood sugars.

## 2014-03-07 NOTE — Progress Notes (Signed)
Next US for growth/BPP scheduled 12/21.

## 2014-03-07 NOTE — Progress Notes (Signed)
Pt C/o pressure in lower abdomen  [employer]: Jean Mays

## 2014-03-10 ENCOUNTER — Other Ambulatory Visit: Payer: Self-pay

## 2014-03-14 ENCOUNTER — Encounter: Payer: Self-pay | Admitting: *Deleted

## 2014-03-14 ENCOUNTER — Ambulatory Visit (INDEPENDENT_AMBULATORY_CARE_PROVIDER_SITE_OTHER): Payer: Self-pay | Admitting: Family Medicine

## 2014-03-14 VITALS — BP 111/71 | HR 102 | Wt 202.4 lb

## 2014-03-14 DIAGNOSIS — O24313 Unspecified pre-existing diabetes mellitus in pregnancy, third trimester: Secondary | ICD-10-CM

## 2014-03-14 DIAGNOSIS — O09522 Supervision of elderly multigravida, second trimester: Secondary | ICD-10-CM

## 2014-03-14 DIAGNOSIS — O24913 Unspecified diabetes mellitus in pregnancy, third trimester: Secondary | ICD-10-CM

## 2014-03-14 LAB — POCT URINALYSIS DIP (DEVICE)
Bilirubin Urine: NEGATIVE
Glucose, UA: NEGATIVE mg/dL
Ketones, ur: NEGATIVE mg/dL
Nitrite: NEGATIVE
PH: 6.5 (ref 5.0–8.0)
PROTEIN: NEGATIVE mg/dL
SPECIFIC GRAVITY, URINE: 1.02 (ref 1.005–1.030)
Urobilinogen, UA: 0.2 mg/dL (ref 0.0–1.0)

## 2014-03-14 LAB — US OB FOLLOW UP

## 2014-03-14 MED ORDER — INSULIN NPH (HUMAN) (ISOPHANE) 100 UNIT/ML ~~LOC~~ SUSP: SUBCUTANEOUS | Status: DC

## 2014-03-14 NOTE — Patient Instructions (Signed)
Diabetes mellitus gestacional (Gestational Diabetes Mellitus) La diabetes mellitus gestacional, ms comnmente conocida como diabetes gestacional es un tipo de diabetes que desarrollan algunas mujeres durante el embarazo. En la diabetes gestacional, el pncreas no produce suficiente insulina (una hormona) o las clulas son menos sensibles a la insulina producida (resistencia a la insulina), o ambas cosas. Normalmente, la insulina mueve los azcares de los alimentos a las clulas de los tejidos. Las clulas de los tejidos utilizan los azcares para obtener energa. La falta de insulina o la falta de una respuesta normal a la insulina hace que el exceso de azcar se acumule en la sangre en lugar de penetrar en las clulas de los tejidos. Como resultado, se producen niveles altos de azcar en la sangre (hiperglucemia). El efecto de los niveles altos de azcar (glucosa) puede causar muchos problemas.  FACTORES DE RIESGO Usted tiene mayor probabilidad de desarrollar diabetes gestacional si tiene antecedentes familiares de diabetes y tambin si tiene uno o ms de los siguientes factores de riesgo:  ndice de masa corporal superior a 30 (obesidad).  Embarazo previo con diabetes gestacional.  La edad avanzada en el momento del embarazo. Si se mantienen los niveles de glucosa en la sangre en un rango normal durante el embarazo, las mujeres pueden tener un embarazo saludable. Si los niveles de glucosa en la sangre no estn bien controlados, puede haber riesgos para usted, el feto o el recin nacido, o durante el trabajo de parto y el parto.  SNTOMAS  Si se presentan sntomas, stos son similares a los sntomas que normalmente experimentar durante el embarazo. Los sntomas de la diabetes gestacional son:   Aumento de la sed (polidipsia).  Aumento de la miccin (poliuria).  Orina con ms frecuencia durante la noche (nocturia).  Prdida de peso. La prdida de peso puede ser muy rpida.  Infecciones  frecuentes y recurrentes.  Cansancio (fatiga).  Debilidad.  Cambios en la visin, como visin borrosa.  Olor a fruta en el aliento.  Dolor abdominal. DIAGNSTICO La diabetes se diagnostica cuando hay aumento de los niveles de glucosa en la sangre. El nivel de glucosa en la sangre puede controlarse en uno o ms de los siguientes anlisis de sangre:  Medicin de glucosa en la sangre en ayunas. No se le permitir comer durante al menos 8 horas antes de que se tome una muestra de sangre.  Pruebas al azar de glucosa en la sangre. El nivel de glucosa en la sangre se controla en cualquier momento del da sin importar el momento en que haya comido.  Prueba de A1c (hemoglobina glucosilada) Una prueba de A1c proporciona informacin sobre el control de la glucosa en la sangre durante los ltimos 3 meses.  Prueba de tolerancia a la glucosa oral (PTGO). La glucosa en la sangre se mide despus de no haber comido (ayunas) durante una a tres horas y despus de beber una bebida que contenga glucosa. Dado que las hormonas que causan la resistencia a la insulina son ms altas alrededor de las semanas 24 a 28 de embarazo, generalmente se realiza una PTGO durante ese tiempo. Si tiene factores de riesgo de diabetes gestacional, su mdico puede hacerle estudios de deteccin antes de las 24semanas de embarazo. TRATAMIENTO   Usted tendr que tomar medicamentos para la diabetes o insulina diariamente para mantener los niveles de glucosa en la sangre en el rango deseado.  Usted tendr que combinar la dosis de insulina con la actividad fsica y la eleccin de alimentos saludables. El objetivo del   tratamiento es mantener el nivel de azcar en la sangre previo a comer (preprandial) y durante la noche entre 60 y 99mg/dl, durante todo el embarazo. El objetivo del tratamiento es mantener el nivel pico de azcar en la sangre despus de comer (glucosa posprandial) entre 100y 140mg/dl. INSTRUCCIONES PARA EL CUIDADO EN EL  HOGAR   Controle su nivel de hemoglobina A1c dos veces al ao.  Contrlese a diario el nivel de glucosa en la sangre segn las indicaciones de su mdico. Es comn realizar controles frecuentes de la glucosa en la sangre.  Supervise las cetonas en la orina cuando est enferma y segn las indicaciones de su mdico.  Tome el medicamento para la diabetes y adminstrese insulina segn las indicaciones de su mdico para mantener el nivel de glucosa en la sangre en el rango deseado.  Nunca se quede sin medicamento para la diabetes o sin insulina. Es necesario que la reciba todos los das.  Ajuste la insulina segn la ingesta de hidratos de carbono. Los hidratos de carbono pueden aumentar los niveles de glucosa en la sangre, pero deben incluirse en su dieta. Los hidratos de carbono aportan vitaminas, minerales y fibra que son una parte esencial de una dieta saludable. Los hidratos de carbono se encuentran en frutas, verduras, cereales integrales, productos lcteos, legumbres y alimentos que contienen azcares aadidos.  Consuma alimentos saludables. Alterne 3 comidas con 3 colaciones.  Aumente de peso saludablemente. El aumento del peso total vara de acuerdo con el ndice de masa corporal que tena antes del embarazo ([facility]).  Lleve una tarjeta de alerta mdica o use una pulsera o medalla de alerta mdica.  Lleve con usted una colacin de 15gramos de hidratos de carbono en todo momento para controlar los niveles bajos de glucosa en la sangre (hipoglucemia). Algunos ejemplos de colaciones de 15gramos de hidratos de carbono son los siguientes:  Tabletas de glucosa, 3 o 4.  Gel de glucosa, tubo de 15 gramos.  Pasas de uva, 2 cucharadas (24 g).  Caramelos de goma, 6.  Galletas de animales, 8.  Jugo de fruta, gaseosa comn, o leche descremada, 4 onzas (120 ml).  Pastillas de goma, 9.  Reconocer la hipoglucemia. Durante el embarazo la hipoglucemia se produce cuando hay niveles de glucosa en la  sangre de 60 mg/dl o menos. El riesgo de hipoglucemia aumenta durante el ayuno o cuando se saltea las comidas, durante o despus de realizar ejercicio intenso y mientras duerme. Los sntomas de hipoglucemia son:  Temblores o sacudidas.  Disminucin de la capacidad de concentracin.  Sudoracin.  Aumento de la frecuencia cardaca.  Dolor de cabeza.  Sequedad en la boca.  Hambre.  Irritabilidad.  Ansiedad.  Sueo agitado.  Alteracin del habla o de la coordinacin.  Confusin.  Tratar la hipoglucemia rpidamente. Si usted est alerta y puede tragar con seguridad, siga la regla de 15/15 que consiste en:  Tome entre 15 y 20gramos de glucosa de accin rpida o carbohidratos. Las opciones de accin rpida son un gel de glucosa, tabletas de glucosa, o 4 onzas (120 ml) de jugo de frutas, gaseosa comn, o leche baja en grasa.  Compruebe su nivel de glucosa en la sangre 15 minutos despus de tomar la glucosa.  Tome entre 15 y 20 gramos ms de glucosa si el nivel de glucosa en la sangre todava es de 70mg/dl o inferior.  Ingiera una comida o una colacin en el lapso de 1 hora una vez que los niveles de glucosa en la sangre vuelven   a la normalidad.  Est atento a la poliuria (miccin excesiva) y la polidipsia (sensacin de mucha sed), que son los primeros signos de la hiperglucemia. El reconocimiento temprano de la hiperglucemia permite un tratamiento oportuno. Trate la hiperglucemia segn le indic su mdico.  Haga actividad fsica por lo menos 30minutos al da o como lo indique su mdico. Se recomienda que 30 minutos despus de cada comida, realice diez minutos de actividad fsica para controlar los niveles de glucosa postprandial en la sangre.  Ajuste su dosis de insulina y la ingesta de alimentos, segn sea necesario, si inicia un nuevo ejercicio o deporte.  Siga su plan para los das de enfermedad cuando no pueda comer o beber como de costumbre.  Evite el tabaco y el  alcohol.  Concurra a todas las visitas de control como se lo haya indicado el mdico.  Siga el consejo del mdico respecto a los controles prenatales y posteriores al parto (postparto), las visitas, la planificacin de las comidas, el ejercicio, los medicamentos, las vitaminas, los anlisis de sangre, otras pruebas mdicas y actividades fsicas.  Realice diariamente el cuidado de la piel y de los pies. Examine su piel y los pies diariamente para ver si tiene cortes, moretones, enrojecimiento, problemas en las uas, sangrado, ampollas o llagas.  Cepllese los dientes y encas por lo menos dos veces al da y use hilo dental al menos una vez por da. Concurra regularmente a las visitas de control con el dentista.  Programe un examen de vista durante el primer trimestre de su embarazo o como lo indique su mdico.  Comparta su plan de control de diabetes en el trabajo o en la escuela.  Mantngase al da con las vacunas.  Aprenda a manejar el estrs.  Obtenga la mayor cantidad posible de informacin sobre la diabetes y solicite ayuda siempre que sea necesario.  Obtenga informacin sobre el amamantamiento y analice esta posibilidad.  Debe controlar el nivel de azcar en la sangre de 6a 12semanas despus del parto. Esto se hace con una prueba de tolerancia a la glucosa oral (PTGO). SOLICITE ATENCIN MDICA SI:   No puede comer alimentos o beber por ms de 6 horas.  Tuvo nuseas o ha vomitado durante ms de 6 horas.  Tiene un nivel de glucosa en la sangre de 200 mg/dl y cetonas en la orina.  Presenta algn cambio en el estado mental.  Desarrolla problemas de visin.  Sufre un dolor persistente de cabeza.  Siente dolor o molestias en la parte superior del abdomen.  Desarrolla una enfermedad grave adicional.  Tuvo diarrea durante ms de 6 horas.  Ha estado enfermo o ha tenido fiebre durante un par de das y no mejora. SOLICITE ATENCIN MDICA DE INMEDIATO SI:   Tiene dificultad  para respirar.  Ya no siente los movimientos del beb.  Est sangrando o tiene flujo vaginal.  Comienza a tener contracciones o trabajo de parto prematuro. ASEGRESE DE QUE:  Comprende estas instrucciones.  Controlar su afeccin.  Recibir ayuda de inmediato si no mejora o si empeora. Document Released: 12/26/2004 Document Revised: 08/02/2013 [employer] Patient Information 2015 [employer]. This information is not intended to replace advice given to you by your health care provider. Make sure you discuss any questions you have with your health care provider.  Lactancia materna (Breastfeeding) Decidir amamantar es una de las mejores elecciones que puede hacer por usted y su beb. El cambio hormonal durante el embarazo produce el desarrollo del tejido mamario y aumenta la cantidad   y el tamao de los conductos galactforos. Estas hormonas tambin permiten que las protenas, los azcares y las grasas de la sangre produzcan la leche materna en las glndulas productoras de leche. Las hormonas impiden que la leche materna sea liberada antes del nacimiento del beb, adems de impulsar el flujo de leche luego del nacimiento. Una vez que ha comenzado a amamantar, pensar en el beb, as como la succin o el llanto, pueden estimular la liberacin de leche de las glndulas productoras de leche.  LOS BENEFICIOS DE AMAMANTAR Para el beb  La primera leche (calostro) ayuda a mejorar el funcionamiento del sistema digestivo del beb.  La leche tiene anticuerpos que ayudan a prevenir las infecciones en el beb.  El beb tiene una menor incidencia de asma, alergias y del sndrome de muerte sbita del lactante.  Los nutrientes en la leche materna son mejores para el beb que la leche maternizada y estn preparados exclusivamente para cubrir las necesidades del beb.  La leche materna mejora el desarrollo cerebral del beb.  Es menos probable que el beb desarrolle otras enfermedades, como obesidad  infantil, asma o diabetes mellitus de tipo 2. Para usted   La lactancia materna favorece el desarrollo de un vnculo muy especial entre la madre y el beb.  Es conveniente. La leche materna siempre est disponible a la temperatura correcta y es econmica.  La lactancia materna ayuda a quemar caloras y a perder el peso ganado durante el embarazo.  Favorece la contraccin del tero al tamao que tena antes del embarazo de manera ms rpida y disminuye el sangrado (loquios) despus del parto.  La lactancia materna contribuye a reducir el riesgo de desarrollar diabetes mellitus de tipo 2, osteoporosis o cncer de mama o de ovario en el futuro. SIGNOS DE QUE EL BEB EST HAMBRIENTO Primeros signos de hambre  Aumenta su estado de alerta o actividad.  Se estira.  Mueve la cabeza de un lado a otro.  Mueve la cabeza y abre la boca cuando se le toca la mejilla o la comisura de la boca (reflejo de bsqueda).  Aumenta las vocalizaciones, tales como sonidos de succin, se relame los labios, emite arrullos, suspiros, o chirridos.  Mueve la mano hacia la boca.  Se chupa con ganas los dedos o las manos. Signos tardos de hambre  Est agitado.  Llora de manera intermitente. Signos de hambre extrema Los signos de hambre extrema requerirn que lo calme y lo consuele antes de que el beb pueda alimentarse adecuadamente. No espere a que se manifiesten los siguientes signos de hambre extrema para comenzar a amamantar:   Agitacin.  Llanto intenso y fuerte.   Gritos. INFORMACIN BSICA SOBRE LA LACTANCIA MATERNA Iniciacin de la lactancia materna  Encuentre un lugar cmodo para sentarse o acostarse, con un buen respaldo para el cuello y la espalda.  Coloque una almohada o una manta enrollada debajo del beb para acomodarlo a la altura de la mama (si est sentada). Las almohadas para amamantar se han diseado especialmente a fin de servir de apoyo para los brazos y el beb mientras  amamanta.  Asegrese de que el abdomen del beb est frente al suyo.  Masajee suavemente la mama. Con las yemas de los dedos, masajee la pared del pecho hacia el pezn en un movimiento circular. Esto estimula el flujo de leche. Es posible que deba continuar este movimiento mientras amamanta si la leche fluye lentamente.  Sostenga la mama con el pulgar por arriba del pezn y los otros   4 dedos por debajo de la mama. Asegrese de que los dedos se encuentren lejos del pezn y de la boca del beb.  Empuje suavemente los labios del beb con el pezn o con el dedo.  Cuando la boca del beb se abra lo suficiente, acrquelo rpidamente a la mama e introduzca todo el pezn y la zona oscura que lo rodea (areola), tanto como sea posible, dentro de la boca del beb.  Debe haber ms areola visible por arriba del labio superior del beb que por debajo del labio inferior.  La lengua del beb debe estar entre la enca inferior y la mama.  Asegrese de que la boca del beb est en la posicin correcta alrededor del pezn (prendida). Los labios del beb deben crear un sello sobre la mama y estar doblados hacia afuera (invertidos).  Es comn que el beb succione durante 2 a 3 minutos para que comience el flujo de leche materna. Cmo debe prenderse Es muy importante que le ensee al beb cmo prenderse adecuadamente a la mama. Si el beb no se prende adecuadamente, puede causarle dolor en el pezn y reducir la produccin de leche materna, y hacer que el beb tenga un escaso aumento de peso. Adems, si el beb no se prende adecuadamente al pezn, puede tragar aire durante la alimentacin. Esto puede causarle molestias al beb. Hacer eructar al beb al cambiar de mama puede ayudarlo a liberar el aire. Sin embargo, ensearle al beb cmo prenderse a la mama adecuadamente es la mejor manera de evitar que se sienta molesto por tragar aire mientras se alimenta. Signos de que el beb se ha prendido adecuadamente al pezn:    Tironea o succiona de modo silencioso, sin causarle dolor.  Se escucha que traga cada 3 o 4 succiones.   Hay movimientos musculares por arriba y por delante de sus odos al succionar. Signos de que el beb no se ha prendido adecuadamente al pezn:   Hace ruidos de succin o de chasquido mientras se alimenta.  Siente dolor en el pezn. Si cree que el beb no se prendi correctamente, deslice el dedo en la comisura de la boca y colquelo entre las encas del beb para interrumpir la succin. Intente comenzar a amamantar nuevamente. Signos de lactancia materna exitosa Signos del beb:   Disminuye gradualmente el nmero de succiones o cesa la succin por completo.  Se duerme.  Relaja el cuerpo.  Retiene una pequea cantidad de leche en la boca.  Se desprende solo del pecho. Signos que presenta usted:  Las mamas han aumentado la firmeza, el peso y el tamao 1 a 3 horas despus de amamantar.  Estn ms blandas inmediatamente despus de amamantar.  Un aumento del volumen de leche, y tambin un cambio en su consistencia y color se producen hacia el quinto da de lactancia materna.  Los pezones no duelen, ni estn agrietados ni sangran. Signos de que su beb recibe la cantidad de leche suficiente  Moja al menos 3 paales en 24 horas. La orina debe ser clara y de color amarillo plido a los 5 das de vida.  Defeca al menos 3 veces en 24 horas a los 5 das de vida. La materia fecal debe ser blanda y amarillenta.  Defeca al menos 3 veces en 24 horas a los 7 das de vida. La materia fecal debe ser grumosa y amarillenta.  No registra una prdida de peso mayor del 10% del peso al nacer durante los primeros 3 das de vida.    Aumenta de peso un promedio de 4 a 7onzas (113 a 198g) por semana despus de los 4 das de vida.  Aumenta de peso, diariamente, de manera uniforme a partir de los 5 das de vida, sin registrar prdida de peso despus de las 2semanas de vida. Despus de  alimentarse, es posible que el beb regurgite una pequea cantidad. Esto es frecuente. FRECUENCIA Y DURACIN DE LA LACTANCIA MATERNA El amamantamiento frecuente la ayudar a producir ms leche y a prevenir problemas de dolor en los pezones e hinchazn en las mamas. Alimente al beb cuando muestre signos de hambre o si siente la necesidad de reducir la congestin de las mamas. Esto se denomina "lactancia a demanda". Evite el uso del chupete mientras trabaja para establecer la lactancia (las primeras 4 a 6 semanas despus del nacimiento del beb). Despus de este perodo, podr ofrecerle un chupete. Las investigaciones demostraron que el uso del chupete durante el primer ao de vida del beb disminuye el riesgo de desarrollar el sndrome de muerte sbita del lactante (SMSL). Permita que el nio se alimente en cada mama todo lo que desee. Contine amamantando al beb hasta que haya terminado de alimentarse. Cuando el beb se desprende o se queda dormido mientras se est alimentando de la primera mama, ofrzcale la segunda. Debido a que, con frecuencia, los recin nacidos permanecen somnolientos las primeras semanas de vida, es posible que deba despertar al beb para alimentarlo. Los horarios de lactancia varan de un beb a otro. Sin embargo, las siguientes reglas pueden servir como gua para ayudarla a garantizar que el beb se alimenta adecuadamente:  Se puede amamantar a los recin nacidos (bebs de 4 semanas o menos de vida) cada 1 a 3 horas.  No deben transcurrir ms de 3 horas durante el da o 5 horas durante la noche sin que se amamante a los recin nacidos.  Debe amamantar al beb 8 veces como mnimo en un perodo de 24 horas, hasta que comience a introducir slidos en su dieta, a los 6 meses de vida aproximadamente. EXTRACCIN DE LECHE MATERNA La extraccin y el almacenamiento de la leche materna le permiten asegurarse de que el beb se alimente exclusivamente de leche materna, aun en momentos en  los que no puede amamantar. Esto tiene especial importancia si debe regresar al trabajo en el perodo en que an est amamantando o si no puede estar presente en los momentos en que el beb debe alimentarse. Su asesor en lactancia puede orientarla sobre cunto tiempo es seguro almacenar leche materna.  El sacaleche es un aparato que le permite extraer leche de la mama a un recipiente estril. Luego, la leche materna extrada puede almacenarse en un refrigerador o congelador. Algunos sacaleches son manuales, mientras que otros son elctricos. Consulte a su asesor en lactancia qu tipo ser ms conveniente para usted. Los sacaleches se pueden comprar; sin embargo, algunos hospitales y grupos de apoyo a la lactancia materna alquilan sacaleches mensualmente. Un asesor en lactancia puede ensearle cmo extraer leche materna manualmente, en caso de que prefiera no usar un sacaleche.  CMO CUIDAR LAS MAMAS DURANTE LA LACTANCIA MATERNA Los pezones se secan, agrietan y duelen durante la lactancia materna. Las siguientes recomendaciones pueden ayudarla a mantener las mamas humectadas y sanas:  Evite usar jabn en los pezones.  Use un sostn de soporte. Aunque no son esenciales, las camisetas sin mangas o los sostenes especiales para amamantar estn diseados para acceder fcilmente a las mamas, para amamantar sin tener que quitarse todo   el sostn o la camiseta. Evite usar sostenes con aro o sostenes muy ajustados.  Seque al aire sus pezones durante 3 a 4minutos despus de amamantar al beb.  Utilice solo apsitos de algodn en el sostn para absorber las prdidas de leche. La prdida de un poco de leche materna entre las tomas es normal.  Utilice lanolina sobre los pezones luego de amamantar. La lanolina ayuda a mantener la humedad normal de la piel. Si usa lanolina pura, no tiene que lavarse los pezones antes de volver a alimentar al beb. La lanolina pura no es txica para el beb. Adems, puede extraer  manualmente algunas gotas de leche materna y masajear suavemente esa leche sobre los pezones, para que la leche se seque al aire. Durante las primeras semanas despus de dar a luz, algunas mujeres pueden experimentar hinchazn en las mamas (congestin mamaria). La congestin puede hacer que sienta las mamas pesadas, calientes y sensibles al tacto. El pico de la congestin ocurre dentro de los 3 a 5 das despus del parto. Las siguientes recomendaciones pueden ayudarla a aliviar la congestin:  Vace por completo las mamas al amamantar o extraer leche. Puede aplicar calor hmedo en las mamas (en la ducha o con toallas hmedas para manos) antes de amamantar o extraer leche. Esto aumenta la circulacin y ayuda a que la leche fluya. Si el beb no vaca por completo las mamas cuando lo amamanta, extraiga la leche restante despus de que haya finalizado.  Use un sostn ajustado (para amamantar o comn) o una camiseta sin mangas durante 1 o 2 das para indicar al cuerpo que disminuya ligeramente la produccin de leche.  Aplique compresas de hielo sobre las mamas, a menos que le resulte demasiado incmodo.  Asegrese de que el beb est prendido y se encuentre en la posicin correcta mientras lo alimenta. Si la congestin persiste luego de 48 horas o despus de seguir estas recomendaciones, comunquese con su mdico o un asesor en lactancia. RECOMENDACIONES GENERALES PARA EL CUIDADO DE LA SALUD DURANTE LA LACTANCIA MATERNA  Consuma alimentos saludables. Alterne comidas y colaciones, y coma 3 de cada una por da. Dado que lo que come afecta la leche materna, es posible que algunas comidas hagan que su beb se vuelva ms irritable de lo habitual. Evite comer este tipo de alimentos si percibe que afectan de manera negativa al beb.  Beba leche, jugos de fruta y agua para satisfacer su sed (aproximadamente 10 vasos al da).  Descanse con frecuencia, reljese y tome sus vitaminas prenatales para evitar la  fatiga, el estrs y la anemia.  Contine con los autocontroles de la mama.  Evite masticar y fumar tabaco.  Evite el consumo de alcohol y drogas. Algunos medicamentos, que pueden ser perjudiciales para el beb, pueden pasar a travs de la leche materna. Es importante que consulte a su mdico antes de tomar cualquier medicamento, incluidos todos los medicamentos recetados y de venta libre, as como los suplementos vitamnicos y herbales. Puede quedar embarazada durante la lactancia. Si desea controlar la natalidad, consulte a su mdico cules son las opciones ms seguras para el beb. SOLICITE ATENCIN MDICA SI:   Usted siente que quiere dejar de amamantar o se siente frustrada con la lactancia.  Siente dolor en las mamas o en los pezones.  Sus pezones estn agrietados o sangran.  Sus pechos estn irritados, sensibles o calientes.  Tiene un rea hinchada en cualquiera de las mamas.  Siente escalofros o fiebre.  Tiene nuseas o vmitos.    Presenta una secrecin de otro lquido distinto de la leche materna de los pezones.  Sus mamas no se llenan antes de amamantar al beb para el quinto da despus del parto.  Se siente triste y deprimida.  El beb est demasiado somnoliento como para comer bien.  El beb tiene problemas para dormir.  Moja menos de 3 paales en 24 horas.  Defeca menos de 3 veces en 24 horas.  La piel del beb o la parte blanca de los ojos se vuelven amarillentas.  El beb no ha aumentado de peso a los 5 das de vida. SOLICITE ATENCIN MDICA DE INMEDIATO SI:   El beb est muy cansado (letargo) y no se quiere despertar para comer.  Le sube la fiebre sin causa. Document Released: 03/18/2005 Document Revised: 03/23/2013 ExitCare Patient Information 2015 ExitCare, LLC. This information is not intended to replace advice given to you by your health care provider. Make sure you discuss any questions you have with your health care provider.  

## 2014-03-14 NOTE — Progress Notes (Signed)
Interpreter - Okey RegalCarol present for visit today.  US for growth/BPP on 12/21 @ [facility]

## 2014-03-14 NOTE — Progress Notes (Signed)
Spanish interpreter: Okey Regalarol used NST reviewed and reactive. FBS 97-107 2 hour pp 110-129 Increase NPH to 37 u q am and 27 u q hs

## 2014-03-15 ENCOUNTER — Encounter: Payer: Self-pay | Admitting: *Deleted

## 2014-03-16 ENCOUNTER — Encounter: Payer: Self-pay | Admitting: Obstetrics & Gynecology

## 2014-03-17 ENCOUNTER — Ambulatory Visit (INDEPENDENT_AMBULATORY_CARE_PROVIDER_SITE_OTHER): Payer: Self-pay | Admitting: *Deleted

## 2014-03-17 VITALS — BP 108/59 | HR 79

## 2014-03-17 DIAGNOSIS — O24913 Unspecified diabetes mellitus in pregnancy, third trimester: Secondary | ICD-10-CM

## 2014-03-17 NOTE — Progress Notes (Signed)
12/17 NST reviewed and reactive 

## 2014-03-21 ENCOUNTER — Ambulatory Visit (HOSPITAL_COMMUNITY)
Admission: RE | Admit: 2014-03-21 | Discharge: 2014-03-21 | Disposition: A | Discharge status: Home / Self Care | Payer: Self-pay | Source: Ambulatory Visit | Attending: Obstetrics & Gynecology | Admitting: Obstetrics & Gynecology

## 2014-03-21 ENCOUNTER — Ambulatory Visit (INDEPENDENT_AMBULATORY_CARE_PROVIDER_SITE_OTHER): Payer: Self-pay | Admitting: Obstetrics and Gynecology

## 2014-03-21 ENCOUNTER — Other Ambulatory Visit: Payer: Self-pay | Admitting: Obstetrics and Gynecology

## 2014-03-21 ENCOUNTER — Encounter: Payer: Self-pay | Admitting: Obstetrics and Gynecology

## 2014-03-21 VITALS — BP 109/73 | HR 81 | Wt 204.1 lb

## 2014-03-21 DIAGNOSIS — Z789 Other specified health status: Secondary | ICD-10-CM

## 2014-03-21 DIAGNOSIS — O24312 Unspecified pre-existing diabetes mellitus in pregnancy, second trimester: Secondary | ICD-10-CM | POA: Insufficient documentation

## 2014-03-21 DIAGNOSIS — O24913 Unspecified diabetes mellitus in pregnancy, third trimester: Secondary | ICD-10-CM

## 2014-03-21 DIAGNOSIS — Z113 Encounter for screening for infections with a predominantly sexual mode of transmission: Secondary | ICD-10-CM

## 2014-03-21 DIAGNOSIS — O0993 Supervision of high risk pregnancy, unspecified, third trimester: Secondary | ICD-10-CM

## 2014-03-21 DIAGNOSIS — O24912 Unspecified diabetes mellitus in pregnancy, second trimester: Secondary | ICD-10-CM

## 2014-03-21 DIAGNOSIS — O09523 Supervision of elderly multigravida, third trimester: Secondary | ICD-10-CM

## 2014-03-21 DIAGNOSIS — O09529 Supervision of elderly multigravida, unspecified trimester: Secondary | ICD-10-CM | POA: Insufficient documentation

## 2014-03-21 DIAGNOSIS — Z794 Long term (current) use of insulin: Secondary | ICD-10-CM

## 2014-03-21 DIAGNOSIS — Z118 Encounter for screening for other infectious and parasitic diseases: Secondary | ICD-10-CM

## 2014-03-21 DIAGNOSIS — Z3A36 36 weeks gestation of pregnancy: Secondary | ICD-10-CM | POA: Insufficient documentation

## 2014-03-21 DIAGNOSIS — E139 Other specified diabetes mellitus without complications: Secondary | ICD-10-CM | POA: Insufficient documentation

## 2014-03-21 DIAGNOSIS — IMO0001 Reserved for inherently not codable concepts without codable children: Secondary | ICD-10-CM

## 2014-03-21 LAB — POCT URINALYSIS DIP (DEVICE)
BILIRUBIN URINE: NEGATIVE
Glucose, UA: NEGATIVE mg/dL
Ketones, ur: NEGATIVE mg/dL
Nitrite: NEGATIVE
PH: 7 (ref 5.0–8.0)
Protein, ur: NEGATIVE mg/dL
Specific Gravity, Urine: 1.015 (ref 1.005–1.030)
UROBILINOGEN UA: 0.2 mg/dL (ref 0.0–1.0)

## 2014-03-21 NOTE — Progress Notes (Signed)
US for growth and BPP today @ [facility] -1100

## 2014-03-21 NOTE — Progress Notes (Signed)
NST reviewed and reactive. CBGs reviewed and majority within range with the exception of fasting which range from 92-99. Will increase bedtime NPH to 29 (from 27). Cultures collected today. F/u growth ultrasound scheduled for today

## 2014-03-22 LAB — GC/CHLAMYDIA PROBE AMP
CT Probe RNA: NEGATIVE
GC Probe RNA: NEGATIVE

## 2014-03-22 LAB — STREP B DNA PROBE: GBSP: NOT DETECTED

## 2014-03-23 LAB — CULTURE, OB URINE: Colony Count: 30000

## 2014-03-28 ENCOUNTER — Ambulatory Visit (INDEPENDENT_AMBULATORY_CARE_PROVIDER_SITE_OTHER): Payer: Self-pay | Admitting: Family Medicine

## 2014-03-28 VITALS — BP 115/73 | HR 87 | Temp 98.0°F | Wt 202.9 lb

## 2014-03-28 DIAGNOSIS — O09522 Supervision of elderly multigravida, second trimester: Secondary | ICD-10-CM

## 2014-03-28 DIAGNOSIS — O24313 Unspecified pre-existing diabetes mellitus in pregnancy, third trimester: Secondary | ICD-10-CM

## 2014-03-28 DIAGNOSIS — O24913 Unspecified diabetes mellitus in pregnancy, third trimester: Secondary | ICD-10-CM

## 2014-03-28 LAB — POCT URINALYSIS DIP (DEVICE)
BILIRUBIN URINE: NEGATIVE
GLUCOSE, UA: NEGATIVE mg/dL
Hgb urine dipstick: NEGATIVE
KETONES UR: NEGATIVE mg/dL
Nitrite: NEGATIVE
PH: 5.5 (ref 5.0–8.0)
Protein, ur: NEGATIVE mg/dL
Specific Gravity, Urine: 1.015 (ref 1.005–1.030)
Urobilinogen, UA: 0.2 mg/dL (ref 0.0–1.0)

## 2014-03-28 LAB — US OB FOLLOW UP

## 2014-03-28 MED ORDER — INSULIN NPH (HUMAN) (ISOPHANE) 100 UNIT/ML ~~LOC~~ SUSP: SUBCUTANEOUS | Status: DC

## 2014-03-28 NOTE — Patient Instructions (Addendum)
Aumenta tu Insulina NPH a 41 unidades en la [place], y 31 unidades al acostarse  Diabetes mellitus gestacional (Gestational Diabetes Mellitus) La diabetes mellitus gestacional, ms comnmente conocida como diabetes gestacional es un tipo de diabetes que desarrollan algunas mujeres durante el [place]. En la diabetes gestacional, el pncreas no produce suficiente insulina (una hormona) o las clulas son menos sensibles a la insulina producida (resistencia a la insulina), o ambas cosas. Normalmente, la [employer] azcares de los alimentos a las clulas de los tejidos. Las clulas de los tejidos [employer] azcares para Psychiatrist. La falta de insulina o la falta de una respuesta normal a la insulina hace que el exceso de azcar se acumule en la sangre en lugar de Customer service manager en las clulas de los tejidos. Como resultado, se producen niveles altos de Banker (hiperglucemia). El [address] de los niveles altos de International aid/development worker (glucosa) puede causar muchos problemas.  FACTORES DE RIESGO Usted tiene mayor probabilidad de desarrollar diabetes gestacional si tiene antecedentes familiares de diabetes y tambin si tiene uno o ms de los siguientes factores de riesgo:  ndice de masa corporal superior a 30 (obesidad).  Embarazo previo con diabetes gestacional.  La edad avanzada en el momento del embarazo. Si se mantienen los niveles de glucosa en la sangre en un rango normal durante el [place], las mujeres pueden tener un embarazo saludable. Si los niveles de glucosa en la sangre no estn bien controlados, puede haber riesgos para usted, el feto o el recin nacido, o durante el [place] de parto y [place].  SNTOMAS  Si se presentan sntomas, stos son similares a los sntomas que normalmente experimentar durante el embarazo. Los sntomas de la diabetes gestacional son:   Wyvonnia Dusky de la sed (polidipsia).  Aumento de la miccin (poliuria).  Orina con ms frecuencia durante la noche  (nocturia).  Prdida de peso. La prdida de peso puede ser muy rpida.  Infecciones frecuentes y recurrentes.  Cansancio (fatiga).  Debilidad.  Cambios en la visin, como visin borrosa.  Olor a Water quality scientist.  Dolor abdominal. DIAGNSTICO La diabetes se diagnostica cuando hay aumento de los niveles de glucosa en la [place]. El nivel de glucosa en la sangre puede controlarse en uno o ms de los siguientes anlisis de sangre:  Medicin de glucosa en la sangre en [place]. No se le permitir comer durante al menos 8 horas antes de que se tome [place] de [place].  Pruebas al azar de glucosa en la sangre. El nivel de glucosa en la sangre se controla en cualquier momento del da sin importar el momento en que haya comido.  Prueba de A1c (hemoglobina glucosilada) Una prueba de A1c proporciona informacin sobre el control de la glucosa en la sangre durante los ltimos 3 meses.  Prueba de tolerancia a la glucosa oral (PTGO). La glucosa en la sangre se mide despus de no haber comido (ayunas) durante una a tres horas y despus de beber una bebida que contenga glucosa. Dado que las hormonas que causan la resistencia a la insulina son ms altas alrededor Countrywide Financial 24 a 28 de Psychiatrist, generalmente se realiza una PTGO durante ese tiempo. Si tiene factores de riesgo de diabetes gestacional, su mdico puede hacerle estudios de Airline pilot antes de las 24semanas de [place]. TRATAMIENTO   Usted tendr que tomar medicamentos para la diabetes o insulina diariamente para Pharmacologist los niveles de glucosa en la sangre en el rango deseado.  Usted tendr [employer]  dosis de insulina con la actividad fsica y la eleccin de alimentos saludables. El objetivo del tratamiento es mantener el nivel de azcar en la sangre previo a comer (preprandial) y durante la noche entre 60 y 99mg /dl, durante todo el [place]. El objetivo del tratamiento es mantener el nivel pico de azcar en la sangre despus de  comer (glucosa posprandial) entre 100y 140mg /dl. INSTRUCCIONES PARA EL CUIDADO EN EL HOGAR   Controle su nivel de hemoglobina A1c dos veces al ao.  Contrlese a diario el nivel de glucosa en la sangre segn las indicaciones de su mdico. Es comn Education officer, environmental controles frecuentes de la glucosa en la [place].  Supervise las cetonas en la orina cuando est enferma y segn las indicaciones de su mdico.  Tome el medicamento para la diabetes y adminstrese insulina segn las indicaciones de su mdico para Radio producer nivel de glucosa en la sangre en el rango deseado.  Nunca se quede sin medicamento para la diabetes o sin insulina. Es necesario que la reciba [employer].  Ajuste la insulina segn la ingesta de hidratos de carbono. Los hidratos de carbono pueden aumentar los niveles de glucosa en la sangre, pero deben incluirse en su dieta. Los hidratos de carbono aportan vitaminas, minerales y [place] que son Neomia Dear parte esencial de una dieta saludable. Los hidratos de carbono se encuentran en frutas, verduras, cereales integrales, productos lcteos, legumbres y alimentos que contienen azcares aadidos.  Consuma alimentos saludables. Alterne 3 comidas con 3 colaciones.  Aumente de peso saludablemente. El aumento del peso total vara de acuerdo con el ndice de masa corporal que tena antes del embarazo [facility]).  Lleve una tarjeta de alerta mdica o use una pulsera o medalla de alerta mdica.  Lleve con usted una colacin de 15gramos de hidratos de carbono en todo momento para controlar los niveles bajos de glucosa en la sangre (hipoglucemia). Algunos ejemplos de colaciones de 15gramos de hidratos de carbono son los siguientes:  Tabletas de glucosa, 3 o 4.  Gel de glucosa, tubo de 15 gramos.  Pasas de uva, 2 cucharadas (24 g).  Caramelos de goma, 6.  Galletas de [place], 8.  Jugo de fruta, gaseosa comn, o [place], 4 onzas (120 ml).  Pastillas de goma, 9.  Reconocer la  hipoglucemia. Durante el embarazo la hipoglucemia se produce cuando hay niveles de glucosa en la sangre de 60 mg/dl o menos. El riesgo de hipoglucemia aumenta durante el ayuno o cuando se saltea las comidas, durante o despus de Education officer, environmental ejercicio intenso y [place] duerme. Los sntomas de hipoglucemia son:  Temblores o sacudidas.  Disminucin de la capacidad de concentracin.  Sudoracin.  Aumento de la frecuencia cardaca.  Dolor de [place].  Sequedad en la boca.  Hambre.  Irritabilidad.  Ansiedad.  Sueo agitado.  Alteracin del habla o de la coordinacin.  Confusin.  Tratar la hipoglucemia rpidamente. Si usted est alerta y puede tragar con seguridad, siga la regla de 15/15 que consiste en:  [employer] 15 y 20gramos de glucosa de accin rpida o carbohidratos. Las opciones de accin rpida son un gel de glucosa, tabletas de glucosa, o 4 onzas (120 ml) de jugo de frutas, gaseosa comn, o leche baja en grasa.  Compruebe su nivel de glucosa en la sangre 15 minutos despus de tomar la glucosa.  Tome entre 15 y 20 gramos ms de glucosa si el nivel de glucosa en la sangre todava es de 70mg /dl o inferior.  Ingiera una comida o una colacin en  el lapso de 1 hora una vez que los niveles de glucosa en la sangre vuelven a la normalidad.  Est atento a la poliuria (miccin excesiva) y la polidipsia (sensacin de mucha sed), que son los primeros signos de la hiperglucemia. El reconocimiento temprano de la hiperglucemia permite un tratamiento oportuno. Trate la hiperglucemia segn le indic su mdico.  Haga actividad fsica por lo menos 30minutos al da o como lo indique su mdico. Se recomienda que 30 minutos despus de cada comida, realice diez minutos de actividad fsica para controlar los niveles de glucosa postprandial en la [place]re.  Ajuste su dosis de insulina y la ingesta de alimentos, segn sea necesario, si inicia un nuevo ejercicio o deporte.  Siga su plan para los [address]  [address]  de enfermedad cuando no pueda comer o beber como de Bla[place]owncostumbre.  Evite el tabaco y el alcohol.  Concurra a todas las visitas de control como se lo haya indicado el mdico.  Siga el consejo del mdico respecto a los controles prenatales y posteriores al parto (postparto), las visitas, la planificacin de las comidas, el ejercicio, los medicamentos, las vitaminas, los anlisis de [place] Heightssangre, otras pruebas mdicas y F[place]villeactividades fsicas.  Realice diariamente el cuidado de la piel y de los pies. Examine su piel y los pies diariamente para ver si tiene cortes, moretones, enrojecimiento, problemas en las uas, sangrado, ampollas o Advertising account plannerllagas.  Cepllese los dientes y encas por lo menos dos veces al da y use hilo dental al menos una vez por da. Concurra regularmente a las visitas de control con el dentista.  Programe un examen de vista durante el primer trimestre de su embarazo o como lo indique su mdico.  Comparta su plan de control de diabetes en el trabajo o en la escuela.  Mantngase al da con las vacunas.  Aprenda a Dealermanejar el estrs.  Obtenga la mayor cantidad posible de informacin sobre la diabetes y solicite ayuda siempre que sea necesario.  Obtenga informacin sobre el amamantamiento y analice esta posibilidad.  Debe controlar el nivel de azcar en la sangre de 6a 12semanas despus del parto. Esto se hace con una prueba de tolerancia a la glucosa oral (PTGO). SOLICITE ATENCIN MDICA SI:   No puede comer alimentos o beber por ms de 6 horas.  Tuvo nuseas o ha vomitado durante ms de 6 horas.  Tiene un nivel de glucosa en la sangre de 200 mg/dl y cetonas en la orina.  Presenta algn cambio en el estado mental.  Desarrolla problemas de visin.  Sufre un dolor persistente de Training and development officercabeza.  Siente dolor o molestias en la parte superior del abdomen.  Desarrolla una enfermedad grave adicional.  Tuvo diarrea durante ms de 6 horas.  Ha estado enfermo o ha tenido fiebre durante un par  [address] y no mejora. SOLICITE ATENCIN MDICA DE INMEDIATO SI:   Tiene dificultad para respirar.  Ya no siente los movimientos del beb.  Est sangrando o tiene flujo vaginal.  Comienza a tener contracciones o trabajo de [place] prematuro. ASEGRESE DE QUE:  Comprende estas instrucciones.  Controlar su afeccin.  Recibir ayuda de inmediato si no mejora o si empeora. Document Released: 12/26/2004 Document Revised: 08/02/2013 [facility] Patient Information 2015 BurrExitCare, MarylandLLC. This information is not intended to replace advice given to you by your health care provider. Make sure you discuss any questions you have with your health care provider.  Tercer trimestre de Psychiatristembarazo (Third Trimester of Pregnancy) El tercer trimestre va desde la semana29 hasta la 42, desde  el sptimo hasta el noveno mes, y es la poca en la que el feto crece ms rpidamente. Hacia el final del noveno mes, el feto mide alrededor de 20pulgadas (45cm) de largo y pesa entre 6 y 10 libras (2,700 y 414,500kg).  CAMBIOS EN EL ORGANISMO Su organismo atraviesa por muchos cambios durante el One Loudounembarazo, y estos varan de Neomia Dearuna mujer a Educational psychologistotra.   Seguir American Standard Companiesaumentando de peso. Es de esperar que aumente entre 25 y 35libras (11 y 16kg) hacia el final del Psychiatristembarazo.  Podrn aparecer las primeras Albertson'sestras en las caderas, el abdomen y las [place] Parkmamas.  Puede tener necesidad de Geographical information systems officerorinar con ms frecuencia porque el feto baja hacia la pelvis y ejerce presin sobre la vejiga.  Debido al Vanetta Muldersembarazo podr sentir Anthoney Haradaacidez estomacal con frecuencia.  Puede estar estreida, ya que ciertas hormonas enlentecen los movimientos de los msculos que New York Life Insuranceempujan los desechos a travs de los intestinos.  Pueden aparecer hemorroides o abultarse e hincharse las venas (venas varicosas).  Puede sentir dolor plvico debido al Con-wayaumento de peso y a que las hormonas del Management consultantembarazo relajan las articulaciones entre los huesos de la pelvis. El dolor de espalda puede ser  consecuencia de la sobrecarga de los msculos que soportan la [place]landpostura.  Tal vez haya cambios en el cabello que pueden incluir su engrosamiento, crecimiento rpido y cambios en la textura. Adems, a algunas mujeres se les cae el cabello durante o despus del embarazo, o tienen el cabello seco o fino. Lo ms probable es que el cabello se le normalice despus del nacimiento del beb.  Las ConAgra Foodsmamas seguirn creciendo y Development worker, communityle dolern. A veces, puede haber una secrecin amarilla de las mamas llamada calostro.  El ombligo puede salir hacia afuera.  Puede sentir que le falta el aire debido a que se expande el tero.  Puede notar que el feto "baja" o lo siente ms bajo, en el abdomen.  Puede tener una prdida de secrecin mucosa con sangre. Esto suele ocurrir en el trmino de unos [address] das a una semana antes de que comience el [place] Hillstrabajo de Midway Cityparto.  El cuello del tero se vuelve delgado y blando (se borra) cerca de la fecha de [place]parto. QU DEBE ESPERAR EN LOS EXMENES PRENATALES  Le harn exmenes prenatales cada 2semanas hasta la semana36. A partir de ese momento le harn exmenes semanales. Durante una visita prenatal de rutina:  La pesarn para asegurarse de que usted y el feto estn creciendo normalmente.  Le tomarn la presin arterial.  Le medirn el abdomen para controlar el desarrollo del beb.  Se escucharn los latidos cardacos fetales.  Se evaluarn los resultados de los estudios solicitados en visitas anteriores.  Le revisarn el cuello del tero cuando est prxima la fecha de parto para controlar si este se ha borrado. Alrededor de la semana36, el mdico le revisar el cuello del tero. Al mismo tiempo, realizar un anlisis de las secreciones del tejido vaginal. Este examen es para determinar si hay un tipo de bacteria, estreptococo Grupo B. El mdico le explicar esto con ms detalle. El mdico puede preguntarle lo siguiente:  Cmo le gustara que fuera el [place]parto.  Cmo se  siente.  Si siente los movimientos del beb.  Si ha tenido sntomas anormales, como prdida de lquido, [place] Islandssangrado, dolores de cabeza intensos o clicos abdominales.  Si tiene Colgate-Palmolivealguna pregunta. Otros exmenes o estudios de deteccin que pueden realizarse durante el tercer trimestre incluyen lo siguiente:  Anlisis de sangre para controlar las concentraciones de hierro (anemia).  Controles fetales para determinar su salud, nivel de [place] y Designer, jewellery. Si tiene [place] enfermedad o hay problemas durante el embarazo, le harn estudios. FALSO TRABAJO DE PARTO Es posible que sienta contracciones leves e irregulares que finalmente desaparecen. Se llaman contracciones de [address] o falso trabajo de [place]. Las [employer] pueden durar horas, [address] o incluso semanas, antes de que el verdadero trabajo de parto se inicie. Si las contracciones ocurren a intervalos regulares, se intensifican o se hacen dolorosas, lo mejor es que la revise el mdico.  SIGNOS DE TRABAJO DE PARTO   Clicos de tipo menstrual.  Contracciones cada o menos.  Contracciones que comienzan en la parte superior del tero y se extienden hacia abajo, a la zona inferior del abdomen y la espalda.  Sensacin de mayor presin en la pelvis o dolor de espalda.  Una secrecin de mucosidad acuosa o con sangre que sale de la vagina. Si tiene alguno de estos signos antes de la semana37 del Psychiatrist, llame a su mdico de inmediato. Debe concurrir al hospital para que la controlen inmediatamente. INSTRUCCIONES PARA EL CUIDADO EN EL HOGAR   Evite fumar, consumir hierbas, beber alcohol y tomar frmacos que no le hayan recetado. Estas sustancias qumicas afectan la formacin y el desarrollo del beb.  Siga las indicaciones del mdico en relacin con el uso de medicamentos. Durante el embarazo, hay medicamentos que son seguros de tomar y otros que no.  Haga actividad fsica solo en la forma indicada por el mdico. Sentir  clicos uterinos es un buen signo para Restaurant manager, fast food actividad fsica.  Contine comiendo alimentos que sanos con regularidad.  Use un sostn que le brinde buen soporte si le [employer].  No se d baos de inmersin en agua caliente, baos turcos ni saunas.  Colquese el cinturn de seguridad cuando conduzca.  No coma carne cruda ni queso sin cocinar; evite el contacto con las bandejas sanitarias de los gatos y la tierra que estos animales usan. Estos elementos contienen grmenes que pueden causar defectos congnitos en el beb.  Tome las vitaminas prenatales.  Si est estreida, pruebe un laxante suave (si el mdico lo autoriza). Consuma ms alimentos ricos en fibra, como vegetales y frutas frescos y Radiation protection practitioner. Beba gran cantidad de lquido para mantener la orina de tono claro o color amarillo plido.  Dese baos de asiento con agua tibia para Engineer, materials o las molestias causadas por las hemorroides. Use una crema para las hemorroides si el mdico la autoriza.  Si tiene venas varicosas, use medias de descanso. Eleve los pies durante , 3 o 4veces por da. Limite la cantidad de sal en su dieta.  Evite levantar objetos pesados, use zapatos de tacones bajos y [place].  Descanse con las piernas elevadas si tiene calambres o dolor de cintura.  Visite a su dentista si no lo ha Occupational hygienist. Use un cepillo de dientes blando para higienizarse los dientes y psese el hilo dental con suavidad.  Puede seguir [employer], a menos que el mdico le indique lo contrario.  No haga viajes largos excepto que sea absolutamente necesario y solo con la autorizacin del mdico.  Tome clases prenatales para Financial trader, Education administrator y hacer preguntas sobre el [place] de parto y [place].  Haga un ensayo de la partida al hospital.  Prepare el bolso que llevar al hospital.  Prepare la habitacin del beb.  Concurra a todas las  visitas prenatales segn las indicaciones  de su mdico. SOLICITE ATENCIN MDICA SI:  No est segura de que est en trabajo de parto o de que ha roto la bolsa de las aguas.  Tiene mareos.  Siente clicos leves, presin en la pelvis o dolor persistente en el abdomen.  Tiene nuseas, vmitos o diarrea persistentes.  Tiene secrecin vaginal con mal olor.  Siente dolor al ConocoPhillips. SOLICITE ATENCIN MDICA DE INMEDIATO SI:   Tiene fiebre.  Tiene una prdida de lquido por la vagina.  Tiene sangrado o pequeas prdidas vaginales.  Siente dolor intenso o clicos en el abdomen.  Sube o baja de peso rpidamente.  Tiene dificultad para respirar y siente dolor de pecho.  Sbitamente se le hinchan mucho el rostro, las [place], los tobillos, los pies o las piernas.  No ha sentido los movimientos del beb durante Georgianne Fick.  Siente un dolor de cabeza intenso que no se alivia con medicamentos.  Hay cambios en la visin. Document Released: 12/26/2004 Document Revised: 03/23/2013 [facility] Patient Information 2015 [place], [place]. This information is not intended to replace advice given to you by your health care provider. Make sure you discuss any questions you have with your health care provider.

## 2014-03-28 NOTE — Progress Notes (Signed)
FBS 95-101 none in range 2 hour pp 110-141 (16 of 21 are out of range) Please schedule IOL on 1/8 Increase her NPH to 41 u q am and 31 u q hs NST reviewed and reactive. Spanish interpreter: Alis used

## 2014-03-31 ENCOUNTER — Other Ambulatory Visit: Payer: Self-pay

## 2014-04-01 NOTE — L&D Delivery Note (Signed)
Delivery Note At 11:25 PM a viable female was delivered via Vaginal, Spontaneous Delivery (Presentation: ; Occiput Posterior).  APGAR: 9, 9; weight: pending .  Infant lifted to pt's abd and dried; cord clamped and cut by support person. Hospital cord blood sample collected. Placenta status: Intact, Spontaneous.  Cord: 3 vessel  Fundus firm with massage. Cytotec 1mg  given PO as prophylaxis to strong hx of PPH. Will keep methergine at bedside to use prn.  Anesthesia: None  Episiotomy: None Lacerations: None Est. Blood Loss (mL): 350  Mom to postpartum.  Baby to Couplet care / Skin to Skin.  Cam HaiSHAW, Caitlyn Buchanan CNM 04/08/2014, 11:55 PM

## 2014-04-04 ENCOUNTER — Ambulatory Visit (INDEPENDENT_AMBULATORY_CARE_PROVIDER_SITE_OTHER): Payer: Self-pay | Admitting: Family Medicine

## 2014-04-04 ENCOUNTER — Ambulatory Visit (HOSPITAL_COMMUNITY)
Admission: RE | Admit: 2014-04-04 | Discharge: 2014-04-04 | Disposition: A | Discharge status: Home / Self Care | Payer: Self-pay | Source: Ambulatory Visit | Attending: Family Medicine | Admitting: Family Medicine

## 2014-04-04 ENCOUNTER — Other Ambulatory Visit: Payer: Self-pay

## 2014-04-04 ENCOUNTER — Other Ambulatory Visit: Payer: Self-pay | Admitting: Family Medicine

## 2014-04-04 VITALS — BP 123/70 | HR 77 | Wt 204.5 lb

## 2014-04-04 DIAGNOSIS — O24913 Unspecified diabetes mellitus in pregnancy, third trimester: Secondary | ICD-10-CM

## 2014-04-04 DIAGNOSIS — O24113 Pre-existing diabetes mellitus, type 2, in pregnancy, third trimester: Secondary | ICD-10-CM | POA: Insufficient documentation

## 2014-04-04 DIAGNOSIS — E119 Type 2 diabetes mellitus without complications: Secondary | ICD-10-CM | POA: Insufficient documentation

## 2014-04-04 DIAGNOSIS — Z3A38 38 weeks gestation of pregnancy: Secondary | ICD-10-CM | POA: Insufficient documentation

## 2014-04-04 DIAGNOSIS — Z794 Long term (current) use of insulin: Secondary | ICD-10-CM | POA: Insufficient documentation

## 2014-04-04 DIAGNOSIS — O09513 Supervision of elderly primigravida, third trimester: Secondary | ICD-10-CM | POA: Insufficient documentation

## 2014-04-04 LAB — POCT URINALYSIS DIP (DEVICE)
BILIRUBIN URINE: NEGATIVE
Glucose, UA: NEGATIVE mg/dL
Hgb urine dipstick: NEGATIVE
Ketones, ur: NEGATIVE mg/dL
Nitrite: NEGATIVE
PH: 7 (ref 5.0–8.0)
PROTEIN: NEGATIVE mg/dL
Specific Gravity, Urine: 1.01 (ref 1.005–1.030)
Urobilinogen, UA: 0.2 mg/dL (ref 0.0–1.0)

## 2014-04-04 NOTE — Progress Notes (Signed)
AFI done @ [place] dept today - 16.09cm. Pt is no longer taking ASA 81 mg - ran out 2 days ago.  IOL scheduled 04/08/14 @ 0700

## 2014-04-04 NOTE — Progress Notes (Signed)
Reactive NST IOL on 1/8.  A few contractions. Labor precautions given.

## 2014-04-06 ENCOUNTER — Telehealth (HOSPITAL_COMMUNITY): Payer: Self-pay | Admitting: *Deleted

## 2014-04-06 NOTE — Telephone Encounter (Signed)
Preadmission screen [phone] interpreter number

## 2014-04-08 ENCOUNTER — Encounter (HOSPITAL_COMMUNITY): Payer: Self-pay

## 2014-04-08 ENCOUNTER — Inpatient Hospital Stay (HOSPITAL_COMMUNITY)
Admission: RE | Admit: 2014-04-08 | Discharge: 2014-04-10 | DRG: 775 | Disposition: A | Discharge status: Home / Self Care | Payer: Medicaid Other | Source: Ambulatory Visit | Attending: Obstetrics and Gynecology | Admitting: Obstetrics and Gynecology

## 2014-04-08 VITALS — BP 101/53 | HR 64 | Temp 98.4°F | Resp 18 | Ht 64.0 in | Wt 204.0 lb

## 2014-04-08 DIAGNOSIS — O3663X0 Maternal care for excessive fetal growth, third trimester, not applicable or unspecified: Secondary | ICD-10-CM | POA: Diagnosis present

## 2014-04-08 DIAGNOSIS — O24424 Gestational diabetes mellitus in childbirth, insulin controlled: Principal | ICD-10-CM | POA: Diagnosis present

## 2014-04-08 DIAGNOSIS — Z3A39 39 weeks gestation of pregnancy: Secondary | ICD-10-CM | POA: Diagnosis present

## 2014-04-08 DIAGNOSIS — O24913 Unspecified diabetes mellitus in pregnancy, third trimester: Secondary | ICD-10-CM

## 2014-04-08 DIAGNOSIS — O09523 Supervision of elderly multigravida, third trimester: Secondary | ICD-10-CM

## 2014-04-08 DIAGNOSIS — O0993 Supervision of high risk pregnancy, unspecified, third trimester: Secondary | ICD-10-CM

## 2014-04-08 DIAGNOSIS — Z3490 Encounter for supervision of normal pregnancy, unspecified, unspecified trimester: Secondary | ICD-10-CM

## 2014-04-08 LAB — GLUCOSE, CAPILLARY
GLUCOSE-CAPILLARY: 93 mg/dL (ref 70–99)
GLUCOSE-CAPILLARY: 61 mg/dL — AB (ref 70–99)
GLUCOSE-CAPILLARY: 71 mg/dL (ref 70–99)
GLUCOSE-CAPILLARY: 79 mg/dL (ref 70–99)
Glucose-Capillary: 111 mg/dL — ABNORMAL HIGH (ref 70–99)

## 2014-04-08 LAB — CBC
HCT: 33.6 % — ABNORMAL LOW (ref 36.0–46.0)
Hemoglobin: 11 g/dL — ABNORMAL LOW (ref 12.0–15.0)
MCH: 28.1 pg (ref 26.0–34.0)
MCHC: 32.7 g/dL (ref 30.0–36.0)
MCV: 85.7 fL (ref 78.0–100.0)
Platelets: 227 10*3/uL (ref 150–400)
RBC: 3.92 MIL/uL (ref 3.87–5.11)
RDW: 15.5 % (ref 11.5–15.5)
WBC: 8.9 10*3/uL (ref 4.0–10.5)

## 2014-04-08 MED ORDER — OXYCODONE-ACETAMINOPHEN 5-325 MG PO TABS: 2.0000 | ORAL_TABLET | ORAL | Status: DC | PRN

## 2014-04-08 MED ORDER — TERBUTALINE SULFATE 1 MG/ML IJ SOLN: 0.2500 mg | Freq: Once | INTRAMUSCULAR | Status: AC | PRN

## 2014-04-08 MED ORDER — OXYTOCIN BOLUS FROM INFUSION
500.0000 mL | INTRAVENOUS | Status: DC
  Administered 2014-04-08: 500 mL via INTRAVENOUS

## 2014-04-08 MED ORDER — FENTANYL CITRATE 0.05 MG/ML IJ SOLN
100.0000 ug | INTRAMUSCULAR | Status: DC | PRN
  Administered 2014-04-08 (×2): 100 ug via INTRAVENOUS
  Filled 2014-04-08 (×2): qty 2

## 2014-04-08 MED ORDER — LIDOCAINE HCL (PF) 1 % IJ SOLN
30.0000 mL | INTRAMUSCULAR | Status: DC | PRN
  Filled 2014-04-08: qty 30

## 2014-04-08 MED ORDER — INSULIN NPH (HUMAN) (ISOPHANE) 100 UNIT/ML ~~LOC~~ SUSP
16.0000 [IU] | Freq: Every day | SUBCUTANEOUS | Status: DC
  Administered 2014-04-08: 16 [IU] via SUBCUTANEOUS
  Filled 2014-04-08: qty 10

## 2014-04-08 MED ORDER — ACETAMINOPHEN 325 MG PO TABS: 650.0000 mg | ORAL_TABLET | ORAL | Status: DC | PRN

## 2014-04-08 MED ORDER — MISOPROSTOL 200 MCG PO TABS
1000.0000 ug | ORAL_TABLET | Freq: Once | ORAL | Status: AC | PRN
  Administered 2014-04-08: 1000 ug via ORAL
  Filled 2014-04-08: qty 5

## 2014-04-08 MED ORDER — CITRIC ACID-SODIUM CITRATE 334-500 MG/5ML PO SOLN: 30.0000 mL | ORAL | Status: DC | PRN

## 2014-04-08 MED ORDER — OXYTOCIN 40 UNITS IN LACTATED RINGERS INFUSION - SIMPLE MED: 62.5000 mL/h | INTRAVENOUS | Status: DC

## 2014-04-08 MED ORDER — MISOPROSTOL 25 MCG QUARTER TABLET
25.0000 ug | ORAL_TABLET | ORAL | Status: DC | PRN
  Administered 2014-04-08: 25 ug via VAGINAL
  Filled 2014-04-08: qty 1
  Filled 2014-04-08: qty 0.25

## 2014-04-08 MED ORDER — LACTATED RINGERS IV SOLN
500.0000 mL | INTRAVENOUS | Status: DC | PRN
  Administered 2014-04-08: 500 mL via INTRAVENOUS

## 2014-04-08 MED ORDER — OXYCODONE-ACETAMINOPHEN 5-325 MG PO TABS
1.0000 | ORAL_TABLET | ORAL | Status: DC | PRN
  Filled 2014-04-08: qty 1

## 2014-04-08 MED ORDER — INSULIN NPH (HUMAN) (ISOPHANE) 100 UNIT/ML ~~LOC~~ SUSP: 20.0000 [IU] | Freq: Every day | SUBCUTANEOUS | Status: DC

## 2014-04-08 MED ORDER — OXYTOCIN 40 UNITS IN LACTATED RINGERS INFUSION - SIMPLE MED
1.0000 m[IU]/min | INTRAVENOUS | Status: DC
  Administered 2014-04-08: 2 m[IU]/min via INTRAVENOUS
  Administered 2014-04-08: 4 m[IU]/min via INTRAVENOUS
  Filled 2014-04-08: qty 1000

## 2014-04-08 MED ORDER — ONDANSETRON HCL 4 MG/2ML IJ SOLN: 4.0000 mg | Freq: Four times a day (QID) | INTRAMUSCULAR | Status: DC | PRN

## 2014-04-08 MED ORDER — LACTATED RINGERS IV SOLN
INTRAVENOUS | Status: DC
  Administered 2014-04-08 (×2): via INTRAVENOUS

## 2014-04-08 MED ORDER — INSULIN NPH (HUMAN) (ISOPHANE) 100 UNIT/ML ~~LOC~~ SUSP: 21.0000 [IU] | Freq: Every day | SUBCUTANEOUS | Status: DC

## 2014-04-08 MED ORDER — METHYLERGONOVINE MALEATE 0.2 MG/ML IJ SOLN
0.2000 mg | Freq: Once | INTRAMUSCULAR | Status: AC | PRN
  Filled 2014-04-08: qty 1

## 2014-04-08 NOTE — Progress Notes (Signed)
Jean Mays is a 34 y.o. [id number] at [id number] by LMP admitted for induction of labor due to Diabetes.  Subjective: Patient in little discomfort at this time. Continues to have contractions. FHT reassuring.  Objective: BP 111/80 mmHg  Pulse 86  Temp(Src) 98.2 F (36.8 C) (Oral)  Resp 18  Ht 5\' 4"  (1.626 m)  Wt 92.534 kg (204 lb)  BMI 35.00 kg/m2  LMP 07/09/2013    CBG: 93 >> 61 >> 111 (@1554 )  FHT:  FHR: 140 bpm, variability: moderate,  accelerations:  Present,  decelerations:  Absent UC:   regular, every 4-7 minutes SVE:   Dilation: 4 Effacement (%): 60 Station: -3 Exam by:: A.Davis,RN  Labs: Lab Results  Component Value Date   WBC 8.9 04/08/2014   HGB 11.0* 04/08/2014   HCT 33.6* 04/08/2014   MCV 85.7 04/08/2014   PLT 227 04/08/2014    Assessment / Plan: Induction of labor due to gestational diabetes,  progressing well on pitocin  Labor: Progressing normally Fetal Wellbeing:  Category I Pain Control:  Labor support without medications and Fentanyl PRN I/D:  GBS neg Anticipated MOD:  NSVD  [name], [name] 04/08/2014, 4:43 PM

## 2014-04-08 NOTE — H&P (Signed)
LABOR ADMISSION HISTORY AND PHYSICAL  Jean Mays is a 34 y.o. female [phone] with IUP at [redacted]w[redacted]d by LMP presenting for IOL 2/2 DM. She reports +FMs, No LOF, no VB, no blurry vision, headaches or peripheral edema, and RUQ pain.  She plans on br/bo feeding.  Dating: By LMP --->  Estimated Date of Delivery: 04/15/14   Prenatal History/Complications:  Past Medical History: Past Medical History  Diagnosis Date  . Gestational diabetes   . Anemia     Past Surgical History: Past Surgical History  Procedure Laterality Date  . No past surgeries      Obstetrical History: OB History    Gravida Para Term Preterm AB TAB SAB Ectopic Multiple Living   0 1 0 1 0 0 3      Social History: History   Social History  . Marital Status: Single    Spouse Name: N/A    Number of Children: N/A  . Years of Education: N/A   Social History Main Topics  . Smoking status: Never Smoker   . Smokeless tobacco: None  . Alcohol Use: No  . Drug Use: No  . Sexual Activity: Yes    Birth Control/ Protection: Pill     Comment: post partum   Other Topics Concern  . None   Social History Narrative    Family History: History reviewed. No pertinent family history.  Allergies: No Known Allergies  Prescriptions prior to admission  Medication Sig Dispense Refill Last Dose  . calcium carbonate (TUMS - DOSED IN MG ELEMENTAL CALCIUM) 500 MG chewable tablet Chew 1 tablet by mouth 2 (two) times daily as needed for indigestion or heartburn.   04/07/2014 at Unknown time  . ferrous sulfate 325 (65 FE) MG tablet Take 1 tablet (325 mg total) by mouth 2 (two) times daily. (Patient taking differently: Take 325 mg by mouth daily with breakfast. ) 30 tablet 0 04/07/2014 at Unknown time  . insulin NPH Human (NOVOLIN N RELION) 100 UNIT/ML injection 41 units in the morning, and 31 units at bedtime (Patient taking differently: Inject 31-41 Units into the skin 2 (two) times daily. 41 units in the morning, and 31 units  at bedtime) 10 mL 11 04/08/2014 at Unknown time  . insulin regular (NOVOLIN R RELION) 100 units/mL injection 15 units with breakfast, 12 units with dinner (Patient taking differently: Inject 12-15 Units into the skin 2 (two) times daily before a meal. 15 units with breakfast, 12 units with supper) 10 mL 11 04/08/2014 at Unknown time  . prenatal vitamin w/FE, FA (PRENATAL 1 + 1) 27-1 MG TABS tablet Take 1 tablet by mouth daily. 30 each  04/07/2014 at Unknown time  . acetaminophen (TYLENOL) 325 MG tablet Take 650 mg by mouth every 6 (six) hours as needed. For pain    prn  . aspirin 81 MG chewable tablet Chew 1 tablet (81 mg total) by mouth daily. (Patient not taking: Reported on 04/04/2014) 90 tablet 3 Not Taking  . famotidine (PEPCID) 20 MG tablet Take 1 tablet (20 mg total) by mouth 2 (two) times daily. (Patient not taking: Reported on 04/08/2014) 60 tablet 3 Taking  . glucose blood test strip Use as instructed 50 each 1 Taking  . Insulin Syringe-Needle U-100 (INSULIN SYRINGE 1CC/30GX5/16") 30G X 5/16" 1 ML MISC Injection SQ 4 times daily as instructed 100 each 6 Taking     Review of Systems   All systems reviewed and negative except as stated in HPI  Blood pressure 107/63, pulse 88, temperature 98.2 F (36.8 C), temperature source Oral, resp. rate 18, height 5\' 4"  (1.626 m), weight 92.534 kg (204 lb), last menstrual period 07/09/2013, currently breastfeeding. General appearance: alert, cooperative, appears stated age, no distress and moderately obese Lungs: clear to auscultation bilaterally Heart: regular rate and rhythm Abdomen: soft, non-tender; bowel sounds normal Pelvic: valva wnl, no discharge Extremities: Homans sign is negative, no sign of DVT Presentation: cephalic Fetal monitoringBaseline: 125 bpm and Variability: Good {> 6 bpm) Uterine activityFrequency: Every 6 minutes Dilation: 3.5 Effacement (%): 60 Station: -3 Exam by:: A.Davis,RN   Prenatal labs: ABO, Rh: O/Positive/-- (07/09  0000) Antibody: Negative (07/09 0000) Rubella:   RPR: NON REAC (10/26 1319)  HBsAg: Negative (07/09 0000)  HIV: NONREACTIVE (10/26 1319)  GBS: NOT DETECTED (12/21 1355)  1 hr Glucola 315 Genetic screening  NIPS wnl Anatomy US wnl   Results for orders placed or performed during the hospital encounter of 04/08/14 (from the past 24 hour(s))  CBC   Collection Time: 04/08/14  8:00 AM  Result Value Ref Range   WBC 8.9 4.0 - 10.5 K/uL   RBC 3.92 3.87 - 5.11 MIL/uL   Hemoglobin 11.0 (L) 12.0 - 15.0 g/dL   HCT 16.133.6 (L) 09.636.0 - 04.546.0 %   MCV 85.7 78.0 - 100.0 fL   MCH 28.1 26.0 - 34.0 pg   MCHC 32.7 30.0 - 36.0 g/dL   RDW 40.915.5 81.111.5 - 91.415.5 %   Platelets 227 150 - 400 K/uL  Glucose, capillary   Collection Time: 04/08/14  8:40 AM  Result Value Ref Range   Glucose-Capillary 93 70 - 99 mg/dL    Patient Active Problem List   Diagnosis Date Noted  . Pregnancy 04/08/2014  . [redacted] weeks gestation of pregnancy   . High-risk pregnancy 10/25/2013  . Language barrier 10/20/2013  . AMA (advanced maternal age) multigravida 35+ 10/20/2013  . LGA (large for gestational age) infant 10/20/2013  . Obesity 10/20/2013  . Diabetes mellitus during pregnancy, antepartum 10/18/2013  . Anemia 02/19/2011    Assessment: Jean Mays is a 34 y.o. [id number] at 3448w0d here for IOL 2/2 DM  #Labor: active #Pain:  lower quadrant discomfort at this time #GBS: neg #FWB: Cat 1 #MOF: br/bo #MOC:??? #Circ:  no  [name], Ian D 04/08/2014, 10:17 AM    OB fellow attestation:  I have seen and examined this patient; I agree with above documentation in the resident's note.   Jean Mays is a 34 y.o. [phone] here for IOL 2/2 A2/BDM on insulin  PE: BP 130/76 mmHg  Pulse 77  Temp(Src) 98.2 F (36.8 C) (Oral)  Resp 18  Ht 5\' 4"  (1.626 m)  Wt 204 lb (92.534 kg)  BMI 35.00 kg/m2  LMP 07/09/2013 Gen: calm comfortable, NAD Resp: normal effort, no distress Abd: gravid  ROS, labs, PMH reviewed  Plan: MOF:  breast MOC: considering IUD ID: GBS neg FWB: cat I Labor: cytotec, then pitocin for IOL   Jean Mays 04/08/2014, 2:40 PM

## 2014-04-09 ENCOUNTER — Encounter (HOSPITAL_COMMUNITY): Payer: Self-pay

## 2014-04-09 LAB — GLUCOSE, CAPILLARY
GLUCOSE-CAPILLARY: 48 mg/dL — AB (ref 70–99)
GLUCOSE-CAPILLARY: 88 mg/dL (ref 70–99)
Glucose-Capillary: 83 mg/dL (ref 70–99)

## 2014-04-09 LAB — RPR: RPR: NONREACTIVE — AB

## 2014-04-09 MED ORDER — ZOLPIDEM TARTRATE 5 MG PO TABS: 5.0000 mg | ORAL_TABLET | Freq: Every evening | ORAL | Status: DC | PRN

## 2014-04-09 MED ORDER — OXYCODONE-ACETAMINOPHEN 5-325 MG PO TABS
2.0000 | ORAL_TABLET | ORAL | Status: DC | PRN
Start: 2014-04-09 — End: 2014-04-10
  Administered 2014-04-09: 1 via ORAL
  Filled 2014-04-09: qty 2

## 2014-04-09 MED ORDER — INSULIN ASPART 100 UNIT/ML ~~LOC~~ SOLN
8.0000 [IU] | Freq: Two times a day (BID) | SUBCUTANEOUS | Status: DC
  Administered 2014-04-09: 8 [IU] via SUBCUTANEOUS

## 2014-04-09 MED ORDER — WITCH HAZEL-GLYCERIN EX PADS: 1.0000 "application " | MEDICATED_PAD | CUTANEOUS | Status: DC | PRN

## 2014-04-09 MED ORDER — LANOLIN HYDROUS EX OINT: TOPICAL_OINTMENT | CUTANEOUS | Status: DC | PRN

## 2014-04-09 MED ORDER — METHYLERGONOVINE MALEATE 0.2 MG/ML IJ SOLN
0.2000 mg | Freq: Once | INTRAMUSCULAR | Status: AC
  Administered 2014-04-09: 0.2 mg via INTRAMUSCULAR

## 2014-04-09 MED ORDER — PRENATAL MULTIVITAMIN CH
1.0000 | ORAL_TABLET | Freq: Every day | ORAL | Status: DC
  Administered 2014-04-09: 1 via ORAL
  Filled 2014-04-09: qty 1

## 2014-04-09 MED ORDER — DIPHENHYDRAMINE HCL 25 MG PO CAPS: 25.0000 mg | ORAL_CAPSULE | Freq: Four times a day (QID) | ORAL | Status: DC | PRN

## 2014-04-09 MED ORDER — ONDANSETRON HCL 4 MG PO TABS: 4.0000 mg | ORAL_TABLET | ORAL | Status: DC | PRN

## 2014-04-09 MED ORDER — INSULIN NPH (HUMAN) (ISOPHANE) 100 UNIT/ML ~~LOC~~ SUSP
20.0000 [IU] | Freq: Every day | SUBCUTANEOUS | Status: DC
  Administered 2014-04-09: 20 [IU] via SUBCUTANEOUS

## 2014-04-09 MED ORDER — SENNOSIDES-DOCUSATE SODIUM 8.6-50 MG PO TABS
2.0000 | ORAL_TABLET | ORAL | Status: DC
  Administered 2014-04-09 (×2): 2 via ORAL
  Filled 2014-04-09 (×2): qty 2

## 2014-04-09 MED ORDER — DIBUCAINE 1 % RE OINT: 1.0000 "application " | TOPICAL_OINTMENT | RECTAL | Status: DC | PRN

## 2014-04-09 MED ORDER — TETANUS-DIPHTH-ACELL PERTUSSIS 5-2.5-18.5 LF-MCG/0.5 IM SUSP: 0.5000 mL | Freq: Once | INTRAMUSCULAR | Status: DC

## 2014-04-09 MED ORDER — SIMETHICONE 80 MG PO CHEW: 80.0000 mg | CHEWABLE_TABLET | ORAL | Status: DC | PRN

## 2014-04-09 MED ORDER — OXYCODONE-ACETAMINOPHEN 5-325 MG PO TABS
1.0000 | ORAL_TABLET | ORAL | Status: DC | PRN
  Administered 2014-04-09: 1 via ORAL

## 2014-04-09 MED ORDER — BENZOCAINE-MENTHOL 20-0.5 % EX AERO
1.0000 "application " | INHALATION_SPRAY | CUTANEOUS | Status: DC | PRN
  Filled 2014-04-09: qty 56

## 2014-04-09 MED ORDER — ONDANSETRON HCL 4 MG/2ML IJ SOLN: 4.0000 mg | INTRAMUSCULAR | Status: DC | PRN

## 2014-04-09 MED ORDER — IBUPROFEN 600 MG PO TABS
600.0000 mg | ORAL_TABLET | Freq: Four times a day (QID) | ORAL | Status: DC
  Administered 2014-04-09 – 2014-04-10 (×6): 600 mg via ORAL
  Filled 2014-04-09 (×6): qty 1

## 2014-04-09 MED ORDER — FERROUS SULFATE 325 (65 FE) MG PO TABS
325.0000 mg | ORAL_TABLET | Freq: Every day | ORAL | Status: DC
  Administered 2014-04-09: 325 mg via ORAL
  Filled 2014-04-09: qty 1

## 2014-04-09 NOTE — Progress Notes (Signed)
Post Partum Day 1 Subjective: PT denies c/o, breast + bottle , BC= pills after pp visit. Pt is on reduced NPH dosing of 16units in hs, 20 units q am this morning Cbg's to be checked fasting and 2 hr pc and hs today   Objective: Blood pressure 105/63, pulse 68, temperature 98.6 F (37 C), temperature source Oral, resp. rate 18, height 5\' 4"  (1.626 m), weight 92.534 kg (204 lb), last menstrual period 07/09/2013, SpO2 98 %, unknown if currently breastfeeding.  Physical Exam:  General: alert and no distress Lochia: appropriate Uterine Fundus: firm Incision:  DVT Evaluation: No evidence of DVT seen on physical exam.   Recent Labs  04/08/14 0800  HGB 11.0*  HCT 33.6*   CBG (last 3)   Recent Labs  04/08/14 1554 04/08/14 2002 04/08/14 2252  GLUCAP 111* 71 79       Assessment/Plan: PPD 1 for A2DM  , could probably be tried off meds Breastfeeding and Contraception breast + bottle  Pills planned. Will d/c insulin , check PC cbg's and hs cbg, consider oral agent if elevated.   LOS: 1 day   Radha Coggins V 04/09/2014, 9:52 AM

## 2014-04-09 NOTE — Progress Notes (Signed)
I was present during labor, I assisted Marcelle SmilingKimberly D Shawn CNM and Austin MilesVeronica RN with some instructions. BY Orlan LeavensViria Alvarez Spanish -Interpreter.

## 2014-04-09 NOTE — Progress Notes (Signed)
I assisted Psychiatristamantha RN from the Nursery to do a baby assessment and vital signs,also I assisted Insurance claims handlerJill RN with a Initial assessment for mom ,  I ordered her breakfast by Orlan LeavensViria Alvarez, Spanish Interpreter

## 2014-04-10 LAB — GLUCOSE, CAPILLARY: GLUCOSE-CAPILLARY: 70 mg/dL (ref 70–99)

## 2014-04-10 MED ORDER — IBUPROFEN 600 MG PO TABS: 600.0000 mg | ORAL_TABLET | Freq: Four times a day (QID) | ORAL | Status: DC

## 2014-04-10 NOTE — Discharge Summary (Signed)
Obstetric Discharge Summary Reason for Admission: induction of labor for A2/B GDM Prenatal Procedures: NST Intrapartum Procedures: spontaneous vaginal delivery Postpartum Procedures: none Complications-Operative and Postpartum: none HEMOGLOBIN  Date Value Ref Range Status  04/08/2014 11.0* 12.0 - 15.0 g/dL Final  54/09/811907/11/2013 14.712.1 g/dL Final  82/95/621307/11/2013 08.612.1 g/dL Final   HCT  Date Value Ref Range Status  04/08/2014 33.6* 36.0 - 46.0 % Final  10/07/2013 37 % Final  10/07/2013 37 % Final   Uncomplicated induction of labor, delivery and postpartum course. CBGs within range, no insulin needed.  Breast and bottle feeding, OCPS for birth control. Follow up in clinic.  Baby doing well.  Physical Exam:  Blood pressure 101/53, pulse 64, temperature 98.4 F (36.9 C), temperature source Oral, resp. rate 18, height 5\' 4"  (1.626 m), weight 204 lb (92.534 kg), last menstrual period 07/09/2013, SpO2 99 %, unknown if currently breastfeeding. General: alert and no distress Lochia: appropriate Uterine Fundus: firm DVT Evaluation: No evidence of DVT seen on physical exam. Negative Homan's sign.  Discharge Diagnoses: Term Pregnancy-delivered  Discharge Information: Date: 04/10/2014 Activity: as outlined in discharge instructions Diet: routine Medications: PNV and Ibuprofen Condition: stable Instructions: refer to discharge instructions Discharge to: home Follow-up Information    [facility].   Why:  Postpartum check. Call clinic/come to MAU for any concerning issues   Contact information:   [address] [place] [id number] [phone]      Newborn Data: Live born female  Birth Weight: 7 lb 13.8 oz (3565 g) APGAR: 9, 9  Home with mother.  Jean NewcomerANYANWU,Maylea Soria A, MD 04/10/2014, 9:31 AM

## 2014-04-10 NOTE — Progress Notes (Signed)
Checked on patient and ordered breakfast for patient.  Spanish Interpreter

## 2014-04-10 NOTE — Progress Notes (Signed)
Discharge instructions reviewed with patient for her care and babys care.  Patient states understanding of home care, medications, activity, signs/symptoms to report to MD and return MD office visit.  Patients significant other and family will assist with her care @ home.  No home equipment needed.  Patient given prescriptions and has personal belongings.  Patient ambulated for discharge in stable condition with staff without incident.  Baby discharged in car seat with parents.

## 2014-04-10 NOTE — Progress Notes (Signed)
Assisted RN with interpretation of patient questions and assessment.  Spanish Interpreter

## 2014-04-10 NOTE — Progress Notes (Signed)
Checked on patients needs and ordered dinner.  Spanish Interpreter -Joselyn GlassmanBenita Mays

## 2014-04-11 LAB — TYPE AND SCREEN
ABO/RH(D): O POS
Antibody Screen: POSITIVE
DAT, IgG: NEGATIVE
DONOR AG TYPE: NEGATIVE
Donor AG Type: NEGATIVE
Donor AG Type: NEGATIVE
Donor AG Type: NEGATIVE
PT AG Type: NEGATIVE
UNIT DIVISION: 0
UNIT DIVISION: 0
Unit division: 0
Unit division: 0

## 2014-04-15 ENCOUNTER — Inpatient Hospital Stay (HOSPITAL_COMMUNITY): Payer: Self-pay

## 2014-05-03 ENCOUNTER — Encounter: Payer: Self-pay | Admitting: Obstetrics & Gynecology

## 2014-05-18 ENCOUNTER — Ambulatory Visit (INDEPENDENT_AMBULATORY_CARE_PROVIDER_SITE_OTHER): Payer: Self-pay | Admitting: Obstetrics & Gynecology

## 2014-05-18 ENCOUNTER — Encounter: Payer: Self-pay | Admitting: Obstetrics & Gynecology

## 2014-05-18 VITALS — BP 121/74 | HR 74 | Temp 98.1°F | Ht 64.0 in | Wt 190.9 lb

## 2014-05-18 DIAGNOSIS — Z789 Other specified health status: Secondary | ICD-10-CM

## 2014-05-18 DIAGNOSIS — Z8632 Personal history of gestational diabetes: Secondary | ICD-10-CM

## 2014-05-18 DIAGNOSIS — Z30011 Encounter for initial prescription of contraceptive pills: Secondary | ICD-10-CM

## 2014-05-18 DIAGNOSIS — Z309 Encounter for contraceptive management, unspecified: Secondary | ICD-10-CM

## 2014-05-18 MED ORDER — NORGESTIMATE-ETH ESTRADIOL 0.25-35 MG-MCG PO TABS: 1.0000 | ORAL_TABLET | Freq: Every day | ORAL | Status: DC

## 2014-05-18 NOTE — Patient Instructions (Signed)
Regrese a la clinica cuando tenga su cita. Si tiene problemas o preguntas, llama a la clinica o vaya a la sala [facility] emergencia al [facility].    

## 2014-05-18 NOTE — Progress Notes (Signed)
     Subjective:     Jean Mays is a 34 y.o. [id number] female who presents for a postpartum visit. She is 5 weeks postpartum following a spontaneous vaginal delivery. Patient is Spanish-speaking only, Spanish interpreter present for this encounter.  I have fully reviewed the prenatal and intrapartum course. The delivery was at 39 gestational weeks.  Anesthesia: none. Postpartum course has been uncomplicated. Baby's course has been uncomplicated. Baby is feeding by breast and bottle. Bleeding staining only. Bowel function is normal. Bladder function is normal. Patient is not sexually active. Contraception method is OCP (estrogen/progesterone). Postpartum depression screening: negative.  The following portions of the patient's history were reviewed and updated as appropriate: allergies, current medications, past family history, past medical history, past social history, past surgical history and problem list..  Normal pap smear in 10/12/13.  Review of Systems Pertinent items are noted in HPI.   Objective:    BP 121/74 mmHg  Pulse 74  Temp(Src) 98.1 F (36.7 C) (Oral)  Ht 5\' 4"  (1.626 m)  Wt 190 lb 14.4 oz (86.592 kg)  BMI 32.75 kg/m2  Breastfeeding? Yes  General:  alert and no distress   Breasts:  inspection negative, no nipple discharge or bleeding, no masses or nodularity palpable  Lungs: clear to auscultation bilaterally  Heart:  regular rate and rhythm  Abdomen: soft, non-tender; bowel sounds normal; no masses,  no organomegaly   Pelvic:  not evaluated        Assessment:   Normal postpartum exam. Pap smear not done at today's visit.   Plan:   1. Contraception: OCP (estrogen/progesterone); prescription given to patient 2. Follow up for 2 hr GTT given history of A2/B GDM 3. Routine preventative health maintenance measures emphasized.  Jaynie CollinsUGONNA  Candelario Steppe, MD, FACOG Attending Obstetrician & Gynecologist Faculty Practice, [facility]

## 2014-06-02 ENCOUNTER — Encounter: Payer: Self-pay | Admitting: Obstetrics & Gynecology

## 2014-07-01 ENCOUNTER — Other Ambulatory Visit: Payer: Self-pay

## 2014-07-01 DIAGNOSIS — Z8632 Personal history of gestational diabetes: Secondary | ICD-10-CM

## 2014-07-04 ENCOUNTER — Other Ambulatory Visit: Payer: Self-pay

## 2014-07-04 DIAGNOSIS — O24429 Gestational diabetes mellitus in childbirth, unspecified control: Secondary | ICD-10-CM

## 2014-07-05 ENCOUNTER — Encounter: Payer: Self-pay | Admitting: Family Medicine

## 2014-07-05 ENCOUNTER — Telehealth: Payer: Self-pay

## 2014-07-05 LAB — GLUCOSE TOLERANCE, 2 HOURS
GLUCOSE, FASTING: 156 mg/dL — AB (ref 70–99)
Glucose, 2 hour: 274 mg/dL — ABNORMAL HIGH (ref 70–139)

## 2014-07-05 NOTE — Telephone Encounter (Signed)
-----   Message from Reva Boresanya S Pratt, MD sent at 07/05/2014  9:30 AM EDT ----- Appears to be diabetic outside of pregnancy---please refer to primary care--advise to watch diet.

## 2014-07-05 NOTE — Telephone Encounter (Signed)
Called pt with Spanish interpreter [employer] Mor[employer] @ home # and LM to return call to the clinics.  Called mobile # and husband picked up phone and gave us another # to contact the patient [phone].

## 2014-07-06 NOTE — Telephone Encounter (Signed)
Called patient with Marly for interpreter, no answer- left message stating we are trying to reach you, please call us back at this clinics.

## 2014-07-20 ENCOUNTER — Encounter: Payer: Self-pay | Admitting: General Practice

## 2014-07-20 ENCOUNTER — Encounter: Payer: Self-pay | Admitting: *Deleted

## 2014-07-20 NOTE — Telephone Encounter (Signed)
Attempted to contact patient with Spanish Interpreter Pearletha AlfredMaria Elena Jimenez, no answer, left message for patient to contact clinic.  Will send certified letter.  Letter sent.

## 2014-12-28 ENCOUNTER — Ambulatory Visit (INDEPENDENT_AMBULATORY_CARE_PROVIDER_SITE_OTHER): Payer: Self-pay | Admitting: Urgent Care

## 2014-12-28 VITALS — BP 122/70 | HR 79 | Temp 98.5°F | Resp 17 | Ht 67.0 in | Wt 189.0 lb

## 2014-12-28 DIAGNOSIS — E1165 Type 2 diabetes mellitus with hyperglycemia: Secondary | ICD-10-CM

## 2014-12-28 DIAGNOSIS — IMO0002 Reserved for concepts with insufficient information to code with codable children: Secondary | ICD-10-CM

## 2014-12-28 DIAGNOSIS — R739 Hyperglycemia, unspecified: Secondary | ICD-10-CM

## 2014-12-28 LAB — COMPREHENSIVE METABOLIC PANEL
ALBUMIN: 4.6 g/dL (ref 3.6–5.1)
ALK PHOS: 93 U/L (ref 33–115)
ALT: 16 U/L (ref 6–29)
AST: 14 U/L (ref 10–30)
BILIRUBIN TOTAL: 0.5 mg/dL (ref 0.2–1.2)
BUN: 6 mg/dL — ABNORMAL LOW (ref 7–25)
CALCIUM: 9.7 mg/dL (ref 8.6–10.2)
CO2: 29 mmol/L (ref 20–31)
Chloride: 99 mmol/L (ref 98–110)
Creat: 0.58 mg/dL (ref 0.50–1.10)
Glucose, Bld: 260 mg/dL — ABNORMAL HIGH (ref 65–99)
Potassium: 3.7 mmol/L (ref 3.5–5.3)
Sodium: 135 mmol/L (ref 135–146)
Total Protein: 7.9 g/dL (ref 6.1–8.1)

## 2014-12-28 LAB — POCT GLYCOSYLATED HEMOGLOBIN (HGB A1C): Hemoglobin A1C: 10.3

## 2014-12-28 MED ORDER — METFORMIN HCL 1000 MG PO TABS: 1000.0000 mg | ORAL_TABLET | Freq: Two times a day (BID) | ORAL | Status: DC

## 2014-12-28 NOTE — Patient Instructions (Addendum)
1 Semana - 1/2 pastilla dos vezes al dia 2 Semana - 1 pastilla en la manana, 1/2 pastilla por la noche 3 Semana - 1 pastilla dos vezes al dia   La diabetes mellitus y los alimentos (Diabetes Mellitus and Food) Es importante que controle su nivel de azcar en la sangre (glucosa). El nivel de glucosa en sangre depende en gran medida de lo que usted come. Comer alimentos saludables en las cantidades [place] a lo largo del Futures trader, aproximadamente a la misma hora [employer], lo ayudar a Chief Operating Officer su nivel de Event organiser. Tambin puede ayudarlo a retrasar o Fish farm manager de la diabetes mellitus. Comer de [employer] saludable incluso puede ayudarlo a Event organiser de presin arterial y a Barista o Pharmacologist un peso saludable.  CMO PUEDEN AFECTARME LOS ALIMENTOS? Carbohidratos Los carbohidratos afectan el nivel de glucosa en sangre ms que cualquier otro tipo de alimento. El nutricionista lo ayudar a Chief Strategy Officer cuntos carbohidratos puede consumir en cada comida y ensearle a contarlos. El recuento de carbohidratos es importante para mantener la glucosa en sangre en un nivel saludable, en especial si utiliza insulina o toma determinados medicamentos para la diabetes mellitus. Alcohol El alcohol puede provocar disminuciones sbitas de la glucosa en sangre (hipoglucemia), en especial si utiliza insulina o toma determinados medicamentos para la diabetes mellitus. La hipoglucemia es una afeccin que puede poner en peligro la vida. Los sntomas de la hipoglucemia (somnolencia, mareos y Administrator) son similares a los sntomas de haber consumido mucho alcohol.  Si el mdico lo autoriza a beber alcohol, hgalo con moderacin y siga estas pautas:  Las mujeres no deben beber ms de un trago por da, y los hombres no deben beber ms de dos tragos por Futures trader. Un trago es igual a:  12 onzas (355 ml) de cerveza  5 onzas de vino (150 ml) de vino  1,5onzas (45ml) de bebidas espirituosas  No  beba con el estmago vaco.  Mantngase hidratado. Beba agua, gaseosas dietticas o t helado sin azcar.  Las gaseosas comunes, los jugos y otros refrescos podran contener muchos carbohidratos y se Heritage manager. QU ALIMENTOS NO SE RECOMIENDAN? Cuando haga las elecciones de alimentos, es importante que recuerde que todos los alimentos son distintos. Algunos tienen menos nutrientes que otros por porcin, aunque podran tener la misma cantidad de caloras o carbohidratos. Es difcil darle al cuerpo lo que necesita cuando consume alimentos con menos nutrientes. Estos son algunos ejemplos de alimentos que debera evitar ya que contienen muchas caloras y carbohidratos, pero pocos nutrientes:  Neurosurgeon trans (la mayora de los alimentos procesados incluyen grasas trans en la etiqueta de Informacin nutricional).  Gaseosas comunes.  Jugos.  Caramelos.  Dulces, como tortas, pasteles, rosquillas y [place].  Comidas fritas. QU ALIMENTOS PUEDO COMER? Consuma alimentos ricos en nutrientes, que nutrirn el cuerpo y lo mantendrn saludable. Los alimentos que debe comer tambin dependern de varios factores, como:  Las caloras que necesita.  Los medicamentos que toma.  Su peso.  El nivel de glucosa en [place].  El [place] de presin arterial.  El nivel de colesterol. Tambin debe consumir una variedad de [place], como:  Protenas, como carne, aves, pescado, tofu, frutos secos y semillas (las protenas de [place] magros son mejores).  Nils Pyle.  Verduras.  Productos lcteos, como [place], queso y yogur (descremados son mejores).  Panes, granos, pastas, cereales, arroz y frijoles.  Grasas, como aceite de [place], [place] sin grasas trans, aceite de canola, aguacate y [place]. TODOS LOS QUE  PADECEN DIABETES MELLITUS TIENEN EL MISMO PLAN DE COMIDAS? Dado que todas las personas que padecen diabetes mellitus son distintas, no hay un solo plan de comidas que funcione para todos. Es muy  importante que se rena con un nutricionista que lo ayudar a crear un plan de comidas adecuado para usted. Document Released: 06/25/2007 Document Revised: 03/23/2013 [facility] Patient Information 2015 [place], [place]. This information is not intended to replace advice given to you by your health care Naketa Daddario. Make sure you discuss any questions you have with your health care Jai Bear.

## 2014-12-28 NOTE — Progress Notes (Signed)
    MRN: [id number] DOB: 1980-10-01  Subjective:   HPI obtained in Spanish.  Jean Mays is a 34 y.o. female presenting for chief complaint of Diabetes  Reports that she has a history of gestational diabetes including a recent glucose tolerance test that was positive (07/2014) about 4 months after she delivered her last child. Has had numbness and tingling in her toes. Admits an unhealthy diet but walks daily for exercise. Denies blurred vision, double vision, polydipsia, polyuria, chest pain, shob, n/v, abdominal pain, hematuria, lower leg swelling, wounds that won't heal in her feet. Denies any other aggravating or relieving factors, no other questions or concerns.  Wandalee has a current medication list which includes the following prescription(s): acetaminophen, glucose blood, ibuprofen, norgestimate-ethinyl estradiol, and prenatal vitamin w/fe, fa. Also has No Known Allergies.  Sondi  has a past medical history of Gestational diabetes and Anemia. Also  has past surgical history that includes No past surgeries.  Objective:   Vitals: BP 122/70 mmHg  Pulse 79  Temp(Src) 98.5 F (36.9 C) (Oral)  Resp 17  Ht  (1.702 m)  Wt 189 lb (85.73 kg)  BMI 29.59 kg/m2  SpO2 99%  LMP 12/15/2014  Physical Exam  Constitutional: She is oriented to person, place, and time. She appears well-developed and well-nourished.  HENT:  Mouth/Throat: Oropharynx is clear and moist.  Eyes: Pupils are equal, round, and reactive to light. No scleral icterus.  Neck: No thyromegaly present.  Cardiovascular: Normal rate, regular rhythm and intact distal pulses.  Exam reveals no gallop and no friction rub.   No murmur heard. Pulmonary/Chest: No respiratory distress. She has no wheezes. She has no rales.  Abdominal: Soft. Bowel sounds are normal. She exhibits no distension and no mass. There is no tenderness.  Musculoskeletal: She exhibits no edema.  Neurological: She is alert and oriented to person, place,  and time.  Skin: Skin is warm and dry. No rash noted. No erythema. No pallor.   Results for orders placed or performed in visit on 12/28/14 (from the past 24 hour(s))  POCT glycosylated hemoglobin (Hb A1C)     Status: None   Collection Time: 12/28/14 11:56 AM  Result Value Ref Range   Hemoglobin A1C 10.3    Assessment and Plan :   1. Elevated blood sugar level 2. Diabetes type 2, uncontrolled - New onset diabetes, start Metformin today, titrate up to 1,000mg  over 3 weeks. Patient to follow up in 1 month. Advised patient that it is okay for her to continue breastfeeding while on Metformin.  Wallis Bamberg, PA-C [facility] [phone] 12/28/2014 11:32 AM

## 2015-01-26 ENCOUNTER — Ambulatory Visit: Payer: Self-pay | Admitting: Urgent Care

## 2015-03-09 ENCOUNTER — Ambulatory Visit (INDEPENDENT_AMBULATORY_CARE_PROVIDER_SITE_OTHER): Payer: Self-pay | Admitting: Family Medicine

## 2015-03-09 VITALS — BP 114/68 | HR 80 | Temp 98.0°F | Resp 14 | Ht 67.0 in | Wt 187.8 lb

## 2015-03-09 DIAGNOSIS — E1165 Type 2 diabetes mellitus with hyperglycemia: Secondary | ICD-10-CM

## 2015-03-09 DIAGNOSIS — E1142 Type 2 diabetes mellitus with diabetic polyneuropathy: Secondary | ICD-10-CM

## 2015-03-09 LAB — COMPLETE METABOLIC PANEL WITHOUT GFR
BUN: 11 mg/dL (ref 7–25)
CO2: 31 mmol/L (ref 20–31)
Glucose, Bld: 105 mg/dL — ABNORMAL HIGH (ref 65–99)
Sodium: 138 mmol/L (ref 135–146)
Total Bilirubin: 0.4 mg/dL (ref 0.2–1.2)
Total Protein: 7.9 g/dL (ref 6.1–8.1)

## 2015-03-09 LAB — HEMOGLOBIN A1C: Hgb A1c MFr Bld: 7.1 % — AB (ref 4.0–6.0)

## 2015-03-09 LAB — COMPLETE METABOLIC PANEL WITH GFR
ALT: 13 U/L (ref 6–29)
AST: 15 U/L (ref 10–30)
Albumin: 4.4 g/dL (ref 3.6–5.1)
Alkaline Phosphatase: 65 U/L (ref 33–115)
Calcium: 9.5 mg/dL (ref 8.6–10.2)
Chloride: 101 mmol/L (ref 98–110)
Creat: 0.64 mg/dL (ref 0.50–1.10)
GFR, Est African American: >89 mL/min (ref >=60)
GFR, Est Non African American: >89 mL/min (ref >=60)
Potassium: 3.9 mmol/L (ref 3.5–5.3)

## 2015-03-09 LAB — POCT GLYCOSYLATED HEMOGLOBIN (HGB A1C): Hemoglobin A1C: 7.1

## 2015-03-09 MED ORDER — METFORMIN HCL 1000 MG PO TABS: 1000.0000 mg | ORAL_TABLET | Freq: Two times a day (BID) | ORAL | Status: DC

## 2015-03-09 NOTE — Patient Instructions (Signed)
Diabetes and Standards of Medical Care Diabetes is complicated. You may find that your diabetes team includes a dietitian, nurse, diabetes educator, eye doctor, and more. To help everyone know what is going on and to help you get the care you deserve, the following schedule of care was developed to help keep you on track. Below are the tests, exams, vaccines, medicines, education, and plans you will need. HbA1c test This test shows how well you have controlled your glucose over the past 2-3 months. It is used to see if your diabetes management plan needs to be adjusted.   It is performed at least 2 times a year if you are meeting treatment goals.  It is performed 4 times a year if therapy has changed or if you are not meeting treatment goals. Blood pressure test  This test is performed at every routine medical visit. The goal is less than 140/90 mm Hg for most people, but 130/80 mm Hg in some cases. Ask your health care provider about your goal. Dental exam  Follow up with the dentist regularly. Eye exam  If you are diagnosed with type 1 diabetes as a child, get an exam upon reaching the age of 80 years or older and having had diabetes for 3-5 years. Yearly eye exams are recommended after that initial eye exam.  If you are diagnosed with type 1 diabetes as an adult, get an exam within 5 years of diagnosis and then yearly.  If you are diagnosed with type 2 diabetes, get an exam as soon as possible after the diagnosis and then yearly. Foot care exam  Visual foot exams are performed at every routine medical visit. The exams check for cuts, injuries, or other problems with the feet.  You should have a complete foot exam performed every year. This exam includes an inspection of the structure and skin of your feet, a check of the pulses in your feet, and a check of the sensation in your feet.  Type 1 diabetes: The first exam is performed 5 years after diagnosis.  Type 2 diabetes: The first  exam is performed at the time of diagnosis.  Check your feet nightly for cuts, injuries, or other problems with your feet. Tell your health care provider if anything is not healing. Kidney function test (urine microalbumin)  This test is performed once a year.  Type 1 diabetes: The first test is performed 5 years after diagnosis.  Type 2 diabetes: The first test is performed at the time of diagnosis.  A serum creatinine and estimated glomerular filtration rate (eGFR) test is done once a year to assess the level of chronic kidney disease (CKD), if present. Lipid profile (cholesterol, HDL, LDL, triglycerides)  Performed every 5 years for most people.  The goal for LDL is less than 100 mg/dL. If you are at high risk, the goal is less than 70 mg/dL.  The goal for HDL is 40 mg/dL-50 mg/dL for men and 50 mg/dL-60 mg/dL for women. An HDL cholesterol of 60 mg/dL or higher gives some protection against heart disease.  The goal for triglycerides is less than 150 mg/dL. Immunizations  The flu (influenza) vaccine is recommended yearly for every person 36 months of age or older who has diabetes.  The pneumonia (pneumococcal) vaccine is recommended for every person 36 years of age or older who has diabetes. Adults 36 years of age or older may receive the pneumonia vaccine as a series of two separate shots.  The hepatitis B  vaccine is recommended for adults shortly after they have been diagnosed with diabetes.  The Tdap (tetanus, diphtheria, and pertussis) vaccine should be given:  According to normal childhood vaccination schedules, for children.  Every 10 years, for adults who have diabetes. Diabetes self-management education  Education is recommended at diagnosis and ongoing as needed. Treatment plan  Your treatment plan is reviewed at every medical visit.   This information is not intended to replace advice given to you by your health care provider. Make sure you discuss any questions you  have with your health care provider.   Document Released: 01/13/2009 Document Revised: 04/08/2014 Document Reviewed: 08/18/2012 Elsevier Interactive Patient Education 2016 Elsevier Inc.  

## 2015-03-09 NOTE — Progress Notes (Signed)
      Chief Complaint:  Chief Complaint  Patient presents with  . Diabetes    HPI: Jean Mays is a 34 y.o. female who [facility] complaining of her for diabetic recheck.  She has numbness and tingling in her hands and feet only minimally Denies  CP, SOB, hypoglycemia, palpitations, presyncope/syncope, polydipsia, polyuria, nausea, vomiting, abd pain Not utd on eye exam, flu vaccine  Past Medical History  Diagnosis Date  . Gestational diabetes   . Anemia    Past Surgical History  Procedure Laterality Date  . No past surgeries     Social History   Social History  . Marital Status: Married    Spouse Name: N/A  . Number of Children: N/A  . Years of Education: N/A   Social History Main Topics  . Smoking status: Never Smoker   . Smokeless tobacco: None  . Alcohol Use: No  . Drug Use: No  . Sexual Activity: Yes    Birth Control/ Protection: Pill     Comment: post partum   Other Topics Concern  . None   Social History Narrative   History reviewed. No pertinent family history. No Known Allergies Prior to Admission medications   Medication Sig Start Date End Date Taking? Authorizing Provider  metFORMIN (GLUCOPHAGE) 1000 MG tablet Take 1 tablet (1,000 mg total) by mouth 2 (two) times daily with a meal. 12/28/14  Yes Wallis BambergMario Mani, PA-C     ROS: The patient denies fevers, chills, night sweats, unintentional weight loss, chest pain, palpitations, wheezing, dyspnea on exertion, nausea, vomiting, abdominal pain, dysuria, hematuria, melena, numbness, weakness, or tingling.   All other systems have been reviewed and were otherwise negative with the exception of those mentioned in the HPI and as above.    PHYSICAL EXAM: Filed Vitals:   03/09/15 1147  BP: 114/68  Pulse: 80  Temp: 98 F (36.7 C)  Resp: 14   Body mass index is 29.41 kg/(m^2).   General: Alert, no acute distress HEENT:  Normocephalic, atraumatic, oropharynx patent. EOMI, PERRLA Fundo exam  normal Cardiovascular:  Regular rate and rhythm, no rubs murmurs or gallops.  No Carotid bruits, radial pulse intact. No pedal edema.  Respiratory: Clear to auscultation bilaterally.  No wheezes, rales, or rhonchi.  No cyanosis, no use of accessory musculature Abdominal: No organomegaly, abdomen is soft and non-tender, positive bowel sounds. No masses. Skin: No rashes. Neurologic: Facial musculature symmetric. Psychiatric: Patient acts appropriately throughout our interaction. Lymphatic: No cervical or submandibular lymphadenopathy Musculoskeletal: Gait intact. No edema, tenderness Microfilament exam normal    LABS: Results for orders placed or performed in visit on 03/09/15  POCT glycosylated hemoglobin (Hb A1C)  Result Value Ref Range   Hemoglobin A1C 7.1      EKG/XRAY:   Primary [facility]   ASSESSMENT/PLAN: Encounter Diagnosis  Name Primary?  . Type 2 diabetes mellitus with diabetic polyneuropathy, without long-term current use of insulin (HCC) Yes   Labs pending Advise to see Wallis BambergMario Mani in 3-4 months Cont with medications Recommend : ADA diet, BP goal <140/90, daily foot exams, tobacco cessation if smoking, annual eye exam, annual flu vaccine, PNA vaccine if age and time appropriate.   Gross sideeffects, risk and benefits, and alternatives of medications d/w patient. Patient is aware that all medications have potential sideeffects and we are unable to predict every sideeffect or drug-drug interaction that may occur.  Ruth Tully DO  03/09/2015 1:21 PM

## 2015-03-16 ENCOUNTER — Encounter: Payer: Self-pay | Admitting: Family Medicine

## 2015-06-15 ENCOUNTER — Encounter: Payer: Self-pay | Admitting: Family Medicine

## 2015-07-13 ENCOUNTER — Ambulatory Visit: Payer: Self-pay | Admitting: Urgent Care

## 2016-04-03 ENCOUNTER — Ambulatory Visit (INDEPENDENT_AMBULATORY_CARE_PROVIDER_SITE_OTHER): Payer: Self-pay | Admitting: Emergency Medicine

## 2016-04-03 VITALS — BP 122/80 | HR 78 | Temp 98.0°F | Resp 16 | Ht 67.0 in | Wt 195.0 lb

## 2016-04-03 DIAGNOSIS — E1165 Type 2 diabetes mellitus with hyperglycemia: Secondary | ICD-10-CM

## 2016-04-03 LAB — GLUCOSE, POCT (MANUAL RESULT ENTRY): POC GLUCOSE: 255 mg/dL — AB (ref 70–99)

## 2016-04-03 MED ORDER — METFORMIN HCL 1000 MG PO TABS: 1000.0000 mg | ORAL_TABLET | Freq: Two times a day (BID) | ORAL | 3 refills | Status: AC

## 2016-04-03 NOTE — Addendum Note (Signed)
Addended by: Evie LacksSAGARDIA, Waverley Krempasky J on: 04/03/2016 02:07 PM   Modules accepted: Orders

## 2016-04-03 NOTE — Patient Instructions (Addendum)
   IF you received an x-ray today, you will receive an invoice from [facility]. Please contact [facility] at [phone] with questions or concerns regarding your invoice.   IF you received labwork today, you will receive an invoice from [facility]. Please contact [facility] at [phone] with questions or concerns regarding your invoice.   Our billing staff will not be able to assist you with questions regarding bills from these companies.  You will be contacted with the lab results as soon as they are available. The fastest way to get your results is to activate your My Chart account. Instructions are located on the last page of this paperwork. If you have not heard from us regarding the results in 2 weeks, please contact this office.     La diabetes mellitus y los alimentos (Diabetes Mellitus and Food) Es importante que controle su nivel de azcar en la sangre (glucosa). El nivel de glucosa en sangre depende en gran medida de lo que usted come. Comer alimentos saludables en las cantidades adecuadas a lo largo del da, aproximadamente a la misma hora todos los das, lo ayudar a controlar su nivel de glucosa en sangre. Tambin puede ayudarlo a retrasar o evitar el empeoramiento de la diabetes mellitus. Comer de manera saludable incluso puede ayudarlo a mejorar el nivel de presin arterial y a alcanzar o mantener un peso saludable. Entre las recomendaciones generales para alimentarse y cocinar los alimentos de forma saludable, se incluyen las siguientes:  Respetar las comidas principales y comer colaciones con regularidad. Evitar pasar largos perodos sin comer con el fin de perder peso.  Seguir una dieta que consista principalmente en alimentos de origen vegetal, como frutas, vegetales, frutos secos, legumbres y cereales integrales.  Utilizar mtodos de coccin a baja temperatura, como hornear, en lugar de mtodos de coccin a alta temperatura, como frer en abundante  aceite. Trabaje con el nutricionista para aprender a usar la informacin nutricional de las etiquetas de los alimentos. CMO PUEDEN AFECTARME LOS ALIMENTOS? Carbohidratos Los carbohidratos afectan el nivel de glucosa en sangre ms que cualquier otro tipo de alimento. El nutricionista lo ayudar a determinar cuntos carbohidratos puede consumir en cada comida y ensearle a contarlos. El recuento de carbohidratos es importante para mantener la glucosa en sangre en un nivel saludable, en especial si utiliza insulina o toma determinados medicamentos para la diabetes mellitus. Alcohol El alcohol puede provocar disminuciones sbitas de la glucosa en sangre (hipoglucemia), en especial si utiliza insulina o toma determinados medicamentos para la diabetes mellitus. La hipoglucemia es una afeccin que puede poner en peligro la vida. Los sntomas de la hipoglucemia (somnolencia, mareos y desorientacin) son similares a los sntomas de haber consumido mucho alcohol. Si el mdico lo autoriza a beber alcohol, hgalo con moderacin y siga estas pautas:  Las mujeres no deben beber ms de un trago por da, y los hombres no deben beber ms de dos tragos por da. Un trago es igual a:  12 onzas (355 ml) de cerveza  5 onzas de vino (150 ml) de vino  1,5onzas (45ml) de bebidas espirituosas  No beba con el estmago vaco.  Mantngase hidratado. Beba agua, gaseosas dietticas o t helado sin azcar.  Las gaseosas comunes, los jugos y otros refrescos podran contener muchos carbohidratos y se deben contar. QU ALIMENTOS NO SE RECOMIENDAN? Cuando haga las elecciones de alimentos, es importante que recuerde que todos los alimentos son distintos. Algunos tienen menos nutrientes que otros por porcin, aunque podran tener la misma   cantidad de caloras o carbohidratos. Es difcil darle al cuerpo lo que necesita cuando consume alimentos con menos nutrientes. Estos son algunos ejemplos de alimentos que debera evitar ya  que contienen muchas caloras y carbohidratos, pero pocos nutrientes:  Grasas trans (la mayora de los alimentos procesados incluyen grasas trans en la etiqueta de Informacin nutricional).  Gaseosas comunes.  Jugos.  Caramelos.  Dulces, como tortas, pasteles, rosquillas y galletas.  Comidas fritas. QU ALIMENTOS PUEDO COMER? Consuma alimentos ricos en nutrientes, que nutrirn el cuerpo y lo mantendrn saludable. Los alimentos que debe comer tambin dependern de varios factores, como:  Las caloras que necesita.  Los medicamentos que toma.  Su peso.  El nivel de glucosa en sangre.  El nivel de presin arterial.  El nivel de colesterol. Debe consumir una amplia variedad de alimentos, por ejemplo:  Protenas.  Cortes de carne magros.  Protenas con bajo contenido de grasas saturadas, como pescado, clara de huevo y frijoles. Evite las carnes procesadas.  Frutas y vegetales.  Frutas y vegetales que pueden ayudar a controlar los niveles sanguneos de glucosa, como manzanas, mangos y batatas.  Productos lcteos.  Elija productos lcteos sin grasa o con bajo contenido de grasa, como leche, yogur y queso.  Cereales, panes, pastas y arroz.  Elija cereales integrales, como panes multicereales, avena en grano y arroz integral. Estos alimentos pueden ayudar a controlar la presin arterial.  Grasas.  Alimentos que contengan grasas saludables, como frutos secos, aguacate, aceite de oliva, aceite de canola y pescado. TODOS LOS QUE PADECEN DIABETES MELLITUS TIENEN EL MISMO PLAN DE COMIDAS? Dado que todas las personas que padecen diabetes mellitus son distintas, no hay un solo plan de comidas que funcione para todos. Es muy importante que se rena con un nutricionista que lo ayudar a crear un plan de comidas adecuado para usted. Esta informacin no tiene como fin reemplazar el consejo del mdico. Asegrese de hacerle al mdico cualquier pregunta que tenga. Document Released:  06/25/2007 Document Revised: 04/08/2014 Document Reviewed: 02/12/2013 Elsevier Interactive Patient Education  2017 Elsevier Inc.  

## 2016-04-03 NOTE — Progress Notes (Signed)
Jean Mays 36 y.o.   Chief Complaint  Patient presents with  . diabetic check    HISTORY OF PRESENT ILLNESS: This is a 36 y.o. female with no complaints but has h/o DMT2 and hasn't taken Metformin for 2 weeks.Asymptomatic. HPI   Prior to Admission medications   Medication Sig Start Date End Date Taking? Authorizing Provider  metFORMIN (GLUCOPHAGE) 1000 MG tablet Take 1 tablet (1,000 mg total) by mouth 2 (two) times daily with a meal. 04/03/16  Yes Georgina QuintMiguel Jose Kyleena Scheirer, MD    No Known Allergies  Patient Active Problem List   Diagnosis Date Noted  . Obesity 10/20/2013  . Diabetes mellitus (HCC) 10/18/2013  . Anemia 02/19/2011    Past Medical History:  Diagnosis Date  . Anemia   . Gestational diabetes     Past Surgical History:  Procedure Laterality Date  . NO PAST SURGERIES      Social History   Social History  . Marital status: Married    Spouse name: N/A  . Number of children: N/A  . Years of education: N/A   Occupational History  . Not on file.   Social History Main Topics  . Smoking status: Never Smoker  . Smokeless tobacco: Never Used  . Alcohol use No  . Drug use: No  . Sexual activity: Yes    Birth control/ protection: Pill     Comment: post partum   Other Topics Concern  . Not on file   Social History Narrative  . No narrative on file    No family history on file.   Review of Systems  Constitutional: Negative.   HENT: Negative.   Eyes: Negative.   Cardiovascular: Negative.   Gastrointestinal: Negative.   Genitourinary: Negative.   Musculoskeletal: Positive for joint pain.       Pain to both antecubital areas, most likely from child carrying.  Skin: Negative.   Neurological: Negative.   Endo/Heme/Allergies: Negative.   Psychiatric/Behavioral: Negative.   All other systems reviewed and are negative.  Vitals:   04/03/16 1125  BP: 122/80  Pulse: 78  Resp: 16  Temp: 98 F (36.7 C)    Physical Exam  Constitutional: She is  oriented to person, place, and time. She appears well-developed and well-nourished.  HENT:  Head: Normocephalic and atraumatic.  Nose: Nose normal.  Mouth/Throat: Oropharynx is clear and moist.  Eyes: Conjunctivae and EOM are normal. Pupils are equal, round, and reactive to light.  Neck: Normal range of motion. Neck supple.  Cardiovascular: Normal rate, regular rhythm, normal heart sounds and intact distal pulses.   Pulmonary/Chest: Effort normal and breath sounds normal.  Abdominal: Soft. Bowel sounds are normal. She exhibits no distension. There is no tenderness.  Musculoskeletal: Normal range of motion.  Antecubital areas: mild tenderness to tendons, no erythema or bruising.  Neurological: She is alert and oriented to person, place, and time.  Skin: Skin is warm and dry. Capillary refill takes less than 2 seconds.  Psychiatric: She has a normal mood and affect. Her behavior is normal.  Vitals reviewed.    ASSESSMENT & PLAN: [name] was seen today for diabetic check.  Diagnoses and all orders for this visit:  Uncontrolled type 2 diabetes mellitus with hyperglycemia, without long-term current use of insulin (HCC) -     CBC with Differential/Platelet -     Comprehensive metabolic panel -     Hemoglobin A1c -     POCT glucose (manual entry) -     metFORMIN (GLUCOPHAGE)  1000 MG tablet; Take 1 tablet (1,000 mg total) by mouth 2 (two) times daily with a meal.   Patient Instructions       IF you received an x-ray today, you will receive an invoice from [facility]. Please contact [facility] at [phone] with questions or concerns regarding your invoice.   IF you received labwork today, you will receive an invoice from [facility]. Please contact [facility] at [phone] with questions or concerns regarding your invoice.   Our billing staff will not be able to assist you with questions regarding bills from these companies.  You will be contacted with the lab  results as soon as they are available. The fastest way to get your results is to activate your My Chart account. Instructions are located on the last page of this paperwork. If you have not heard from [place] regarding the results in 2 weeks, please contact this office.     La diabetes mellitus y los alimentos (Diabetes Mellitus and Food) Es importante que controle su nivel de azcar en la sangre (glucosa). El nivel de glucosa en sangre depende en gran medida de lo que usted come. Comer alimentos saludables en las cantidades [place] a lo largo del Futures trader, aproximadamente a la misma hora [employer], lo ayudar a Chief Operating Officer su nivel de Event organiser. Tambin puede ayudarlo a retrasar o Fish farm manager de la diabetes mellitus. Comer de [employer] saludable incluso puede ayudarlo a Event organiser de presin arterial y a Barista o Pharmacologist un peso saludable. Entre las recomendaciones generales para alimentarse y Water quality scientist los alimentos de forma saludable, se incluyen las siguientes:  Respetar las comidas principales y comer colaciones con regularidad. Evitar pasar largos perodos sin comer con el fin de perder peso.  Seguir una dieta que consista principalmente en alimentos de origen vegetal, como frutas, vegetales, frutos secos, legumbres y cereales integrales.  Utilizar mtodos de coccin a baja temperatura, como hornear, en lugar de mtodos de coccin a alta temperatura, como frer en abundante aceite. Trabaje con el nutricionista para aprender a Acupuncturist nutricional de las etiquetas de los alimentos. CMO PUEDEN AFECTARME LOS ALIMENTOS? Carbohidratos  Los carbohidratos afectan el nivel de glucosa en sangre ms que cualquier otro tipo de alimento. El nutricionista lo ayudar a Chief Strategy Officer cuntos carbohidratos puede consumir en cada comida y ensearle a contarlos. El recuento de carbohidratos es importante para mantener la glucosa en sangre en un nivel saludable, en especial si  utiliza insulina o toma determinados medicamentos para la diabetes mellitus. Alcohol  El alcohol puede provocar disminuciones sbitas de la glucosa en sangre (hipoglucemia), en especial si utiliza insulina o toma determinados medicamentos para la diabetes mellitus. La hipoglucemia es una afeccin que puede poner en peligro la vida. Los sntomas de la hipoglucemia (somnolencia, mareos y Administrator) son similares a los sntomas de haber consumido mucho alcohol. Si el mdico lo autoriza a beber alcohol, hgalo con moderacin y siga estas pautas:  Las mujeres no deben beber ms de un trago por da, y los hombres no deben beber ms de dos tragos por Futures trader. Un trago es igual a:  12 onzas (355 ml) de cerveza  5 onzas de vino (150 ml) de vino  1,5onzas (45ml) de bebidas espirituosas  No beba con el estmago vaco.  Mantngase hidratado. Beba agua, gaseosas dietticas o t helado sin azcar.  Las gaseosas comunes, los jugos y otros refrescos podran contener muchos carbohidratos y se Heritage manager. QU ALIMENTOS NO SE RECOMIENDAN? Cuando  haga las elecciones de alimentos, es importante que recuerde que todos los alimentos son distintos. Algunos tienen menos nutrientes que otros por porcin, aunque podran tener la misma cantidad de caloras o carbohidratos. Es difcil darle al cuerpo lo que necesita cuando consume alimentos con menos nutrientes. Estos son algunos ejemplos de alimentos que debera evitar ya que contienen muchas caloras y carbohidratos, pero pocos nutrientes:  Neurosurgeon trans (la mayora de los alimentos procesados incluyen grasas trans en la etiqueta de Informacin nutricional).  Gaseosas comunes.  Jugos.  Caramelos.  Dulces, como tortas, pasteles, rosquillas y [place].  Comidas fritas. QU ALIMENTOS PUEDO COMER? Consuma alimentos ricos en nutrientes, que nutrirn el cuerpo y lo mantendrn saludable. Los alimentos que debe comer tambin dependern de varios factores,  como:  Las caloras que necesita.  Los medicamentos que toma.  Su peso.  El nivel de glucosa en [place].  El [place] de presin arterial.  El nivel de colesterol. Debe consumir una amplia variedad de alimentos, por ejemplo:  Protenas.  Cortes de [employer].  Protenas con bajo contenido de grasas saturadas, como pescado, clara de huevo y frijoles. Evite las carnes procesadas.  Frutas y vegetales.  Frutas y Sports administrator que pueden ayudar a Chief Operating Officer los niveles sanguneos de [place], como [place], mangos y batatas.  Productos lcteos.  Elija productos lcteos sin grasa o con bajo contenido de [place], como [place], yogur y [place].  Cereales, panes, pastas y arroz.  Elija cereales integrales, como panes multicereales, avena en grano y arroz integral. Estos alimentos pueden ayudar a controlar la presin arterial.  Rosalin Hawking.  Alimentos que contengan grasas saludables, como frutos secos, Chartered certified accountant, aceite de [place], aceite de canola y pescado. TODOS LOS QUE PADECEN DIABETES MELLITUS TIENEN EL MISMO PLAN DE COMIDAS? Dado que todas las personas que padecen diabetes mellitus son distintas, no hay un solo plan de comidas que funcione para todos. Es muy importante que se rena con un nutricionista que lo ayudar a crear un plan de comidas adecuado para usted. Esta informacin no tiene Theme park manager el consejo del mdico. Asegrese de hacerle al mdico cualquier pregunta que tenga. Document Released: 06/25/2007 Document Revised: 04/08/2014 Document Reviewed: 02/12/2013 Elsevier Interactive Patient Education  2017 Elsevier Inc.      Edwina Barth, MD [facility]

## 2016-04-04 LAB — COMPREHENSIVE METABOLIC PANEL
ALBUMIN: 4.4 g/dL (ref 3.5–5.5)
ALK PHOS: 80 IU/L (ref 39–117)
ALT: 14 IU/L (ref 0–32)
AST: 11 IU/L (ref 0–40)
Albumin/Globulin Ratio: 1.4 (ref 1.2–2.2)
BUN / CREAT RATIO: 11 (ref 9–23)
BUN: 7 mg/dL (ref 6–20)
Bilirubin Total: 0.2 mg/dL (ref 0.0–1.2)
CO2: 24 mmol/L (ref 18–29)
CREATININE: 0.65 mg/dL (ref 0.57–1.00)
Calcium: 9.3 mg/dL (ref 8.7–10.2)
Chloride: 97 mmol/L (ref 96–106)
GFR calc non Af Amer: 114 mL/min/{1.73_m2} (ref >59)
GFR, EST AFRICAN AMERICAN: 131 mL/min/{1.73_m2} (ref >59)
GLOBULIN, TOTAL: 3.2 g/dL (ref 1.5–4.5)
GLUCOSE: 293 mg/dL — AB (ref 65–99)
Potassium: 4.4 mmol/L (ref 3.5–5.2)
SODIUM: 137 mmol/L (ref 134–144)
TOTAL PROTEIN: 7.6 g/dL (ref 6.0–8.5)

## 2016-04-04 LAB — CBC WITH DIFFERENTIAL/PLATELET
Basophils Absolute: 0 10*3/uL (ref 0.0–0.2)
Basos: 0 %
EOS (ABSOLUTE): 0.2 10*3/uL (ref 0.0–0.4)
EOS: 3 %
Hematocrit: 36.6 % (ref 34.0–46.6)
Hemoglobin: 11.7 g/dL (ref 11.1–15.9)
IMMATURE GRANULOCYTES: 0 %
Immature Grans (Abs): 0 10*3/uL (ref 0.0–0.1)
LYMPHS ABS: 2.8 10*3/uL (ref 0.7–3.1)
Lymphs: 45 %
MCH: 28.1 pg (ref 26.6–33.0)
MCHC: 32 g/dL (ref 31.5–35.7)
MCV: 88 fL (ref 79–97)
Monocytes Absolute: 0.3 10*3/uL (ref 0.1–0.9)
Monocytes: 5 %
NEUTROS ABS: 3 10*3/uL (ref 1.4–7.0)
Neutrophils: 47 %
Platelets: 273 10*3/uL (ref 150–379)
RBC: 4.16 x10E6/uL (ref 3.77–5.28)
RDW: 14.8 % (ref 12.3–15.4)
WBC: 6.3 10*3/uL (ref 3.4–10.8)

## 2016-04-04 LAB — HEMOGLOBIN A1C
Est. average glucose Bld gHb Est-mCnc: 186 mg/dL
HEMOGLOBIN A1C: 8.1 % — AB (ref 4.8–5.6)

## 2016-04-06 ENCOUNTER — Telehealth: Payer: Self-pay

## 2016-04-06 NOTE — Telephone Encounter (Signed)
Pt advised was sent 04/03/16, I recalled it in now

## 2016-04-06 NOTE — Telephone Encounter (Signed)
Pt was seen on 04/03/16 and the meds that he was given has not been sent to the pharmacy   Best number [phone]

## 2016-04-30 ENCOUNTER — Ambulatory Visit (INDEPENDENT_AMBULATORY_CARE_PROVIDER_SITE_OTHER): Payer: Self-pay | Admitting: Physician Assistant

## 2016-04-30 VITALS — BP 126/80 | HR 104 | Temp 98.2°F | Resp 17 | Ht 66.5 in | Wt 193.0 lb

## 2016-04-30 DIAGNOSIS — N6452 Nipple discharge: Secondary | ICD-10-CM

## 2016-04-30 NOTE — Progress Notes (Signed)
Pt given discharge instructions via [facility]

## 2016-04-30 NOTE — Patient Instructions (Addendum)
Please call the [facility] for free [facility] @ [phone] After completion, call [facility] to schedule an appoimtment @ [phone]    IF you received an x-ray today, you will receive an invoice from [facility]. Please contact [facility] at [phone] with questions or concerns regarding your invoice.   IF you received labwork today, you will receive an invoice from [facility]. Please contact [facility] at [phone] with questions or concerns regarding your invoice.   Our billing staff will not be able to assist you with questions regarding bills from these companies.  You will be contacted with the lab results as soon as they are available. The fastest way to get your results is to activate your My Chart account. Instructions are located on the last page of this paperwork. If you have not heard from us regarding the results in 2 weeks, please contact this office.

## 2016-04-30 NOTE — Progress Notes (Signed)
[facility] [address] [place] [address] [phone]  Date:  04/30/2016   Name:  Jean Mays   DOB:  1980/07/25   MRN:  [id number]  PCP:  No PCP Per Patient    History of Present Illness:  Jean Mays is a 36 y.o. female patient who presents to [facility] for cc of nipple discharge of the right breast.  teletranslator david   1 week of breast, yellow liquid is coming from the nipple.  No swelling or redness.  There is no hair.  When she presses on it, the pain would start.  She would have some itching but this has since resolved.  Stopped breast feeding 10 months.  No breast disease both personally or professionally.  No fever.  No arm pain.  No breast tenderness with menses.  No rash or change in breast skin. Patient's last menstrual period was 04/30/2016 (approximate).    Patient Active Problem List   Diagnosis Date Noted  . Obesity 10/20/2013  . Diabetes mellitus (HCC) 10/18/2013  . Anemia 02/19/2011    Past Medical History:  Diagnosis Date  . Anemia   . Gestational diabetes     Past Surgical History:  Procedure Laterality Date  . NO PAST SURGERIES      Social History  Substance Use Topics  . Smoking status: Never Smoker  . Smokeless tobacco: Never Used  . Alcohol use No    No family history on file.  No Known Allergies  Medication list has been reviewed and updated.  Current Outpatient Prescriptions on File Prior to Visit  Medication Sig Dispense Refill  . metFORMIN (GLUCOPHAGE) 1000 MG tablet Take 1 tablet (1,000 mg total) by mouth 2 (two) times daily with a meal. 180 tablet 3   No current facility-administered medications on file prior to visit.     ROS ROS otherwise unremarkable unless listed above.    Physical Examination: BP 126/80 (BP Location: Right Arm, Patient Position: Sitting, Cuff Size: Normal)   Pulse (!) 104   Temp 98.2 F (36.8 C) (Oral)   Resp 17   Ht 5' 6.5" (1.689 m)   Wt 193 lb (87.5 kg)   LMP  04/30/2016 (Approximate)   SpO2 98%   BMI 30.68 kg/m  Ideal Body Weight: Weight in (lb) to have BMI = 25: 156.9  Physical Exam  Constitutional: She is oriented to person, place, and time. She appears well-developed and well-nourished. No distress.  HENT:  Head: Normocephalic and atraumatic.  Right Ear: External ear normal.  Left Ear: External ear normal.  Eyes: Conjunctivae and EOM are normal. Pupils are equal, round, and reactive to light.  Cardiovascular: Normal rate.   Pulmonary/Chest: Effort normal. No respiratory distress. Right breast exhibits nipple discharge (white milkly like discharge after considerable palpation and squeezing from patient. ). Right breast exhibits no inverted nipple, no mass, no skin change and no tenderness. Left breast exhibits no inverted nipple, no mass, no nipple discharge, no skin change and no tenderness. There is no breast swelling.  Lymphadenopathy:    She has no axillary adenopathy.       Right: No supraclavicular adenopathy present.       Left: No supraclavicular adenopathy present.  Neurological: She is alert and oriented to person, place, and time.  Skin: She is not diaphoretic.  Psychiatric: She has a normal mood and affect. Her behavior is normal.     Assessment and Plan: Jean Mays is a 36 y.o. female who  is here today for cc of nipple discharge. Mammogram and ultrasound ordered. [facility], and contacting breast center for appt.  Culture obtained.  Nipple discharge - Plan: WOUND CULTURE, MM DIAG BREAST TOMO BILATERAL, US BREAST LTD UNI RIGHT INC AXILLA, CANCELED: MM DIGITAL SCREENING BILATERAL, CANCELED: MM Digital Diagnostic Unilat R, CANCELED: MM Digital Diagnostic Unilat R  Trena PlattStephanie Joniel Graumann, PA-C [facility] 2/2/20188:24 AM

## 2016-05-02 LAB — WOUND CULTURE: Organism ID, Bacteria: NONE SEEN

## 2016-05-03 ENCOUNTER — Ambulatory Visit
Admission: RE | Admit: 2016-05-03 | Discharge: 2016-05-03 | Disposition: A | Discharge status: Home / Self Care | Payer: No Typology Code available for payment source | Source: Ambulatory Visit | Attending: Physician Assistant | Admitting: Physician Assistant

## 2016-05-03 DIAGNOSIS — N6452 Nipple discharge: Secondary | ICD-10-CM

## 2016-08-07 ENCOUNTER — Ambulatory Visit: Payer: Self-pay | Admitting: Physician Assistant

## 2017-06-30 ENCOUNTER — Other Ambulatory Visit: Payer: Self-pay | Admitting: Emergency Medicine

## 2017-06-30 DIAGNOSIS — E1165 Type 2 diabetes mellitus with hyperglycemia: Secondary | ICD-10-CM

## 2017-07-01 ENCOUNTER — Encounter: Payer: Self-pay | Admitting: Physician Assistant

## 2019-10-05 ENCOUNTER — Other Ambulatory Visit: Payer: Self-pay

## 2019-10-05 DIAGNOSIS — N644 Mastodynia: Secondary | ICD-10-CM

## 2019-10-21 ENCOUNTER — Other Ambulatory Visit: Payer: Self-pay

## 2019-10-21 ENCOUNTER — Other Ambulatory Visit (HOSPITAL_COMMUNITY)
Admission: RE | Admit: 2019-10-21 | Discharge: 2019-10-21 | Disposition: A | Discharge status: Home / Self Care | Payer: No Typology Code available for payment source | Source: Ambulatory Visit | Attending: Obstetrics and Gynecology | Admitting: Obstetrics and Gynecology

## 2019-10-21 ENCOUNTER — Ambulatory Visit: Payer: Self-pay | Admitting: *Deleted

## 2019-10-21 VITALS — BP 130/76 | Temp 98.0°F | Wt 187.8 lb

## 2019-10-21 DIAGNOSIS — N6452 Nipple discharge: Secondary | ICD-10-CM | POA: Insufficient documentation

## 2019-10-21 DIAGNOSIS — N644 Mastodynia: Secondary | ICD-10-CM

## 2019-10-21 DIAGNOSIS — Z1239 Encounter for other screening for malignant neoplasm of breast: Secondary | ICD-10-CM

## 2019-10-21 NOTE — Progress Notes (Signed)
[name] is a 39 y.o. female who presents to [facility] clinic today with complaints of left breast pain.    Pap Smear: Pap smear not completed today. Last Pap smear was in June 2020 at [facility]. and was normal. Per patient has no history of an abnormal Pap smear. Last Pap smear result is not available in Epic. Pap smear results from 2015 are available in Epic.   Physical exam: Breasts Breasts asymmetrical, R > L but patient states that this is known to her. No skin abnormalities bilateral breasts. No nipple retraction bilateral breasts. There was white discharge noted in the left nipple, however, discharge appeared clear yellow on expression. A sample of this discharge was collected. No lymphadenopathy. No lumps palpated bilateral breasts. Patient complained of left breast pain that she describes as a 3/10 in severity. She states that she has noticed the pain for 3 weeks and mentions that the pain comes and goes.      Pelvic/Bimanual Pap is not indicated today.    Smoking History: Patient has never smoked.    Patient Navigation: Patient education provided. Access to services provided for patient through [facility] program. Natale Lay, Spanish interpreter provided through [facility].    Breast and Cervical Cancer Risk Assessment: Patient does not have family history of breast cancer, known genetic mutations, or radiation treatment to the chest before age 77. Patient does not have history of cervical dysplasia, immunocompromised, or DES exposure in-utero.  Risk Assessment    Risk Scores      10/21/2019   Last edited by: Mila Homer, RN   5-year risk: 0.3 %   Lifetime risk: 5.1 %         A: [facility] exam without pap smear Complaints of left breat pain.  P: Referred patient to the [facility] for a diagnostic mammogram. Appointment scheduled October 22, 2019 at 10:10am.  Mila Homer, RN, FNP student 10/21/2019 4:14 PM   Attestation of  Supervision of Student:  I confirm that I have verified the information documented in the nurse practitioner student's note and that I have also personally reperformed the history, physical exam and all medical decision making activities.  I have verified that all services and findings are accurately documented in this student's note; and I agree with management and plan as outlined in the documentation. I have also made any necessary editorial changes.  Brannock, Kathaleen Maser, RN [facility] [employer], [facility] 10/21/2019 10:26 PM

## 2019-10-21 NOTE — Patient Instructions (Addendum)
Informed Jean Mays about breast self awareness. Patient did not need a Pap smear today due to last Pap smear was in 08/2018 per patient. Let her know [facility] will cover Pap smears every 3 years unless has a history of abnormal Pap smears. Referred patient to the [facility] for diagnostic mammogram. Appointment scheduled for October 22, 2019 at 10:10am. Patient aware of appointment and will be there. Jean Mays verbalized understanding.  Jean Homer, RN, FNP student 4:24 PM

## 2019-10-22 ENCOUNTER — Other Ambulatory Visit: Payer: No Typology Code available for payment source

## 2019-10-22 LAB — CYTOLOGY - NON PAP

## 2019-10-26 ENCOUNTER — Telehealth: Payer: Self-pay

## 2019-10-26 NOTE — Telephone Encounter (Signed)
Via Joslyn Hy, Patient informed breast discharge negative (no malignancy identified). Patient was relieved with the results and asked if we knew the cause. Patient informed caused is not known at this time,  needs to to keep diagnostic mammogram. Patient verbalized understanding, rescheduled diagnostic mammogram to 11/02/2019.

## 2019-11-02 ENCOUNTER — Other Ambulatory Visit: Payer: No Typology Code available for payment source

## 2019-11-11 ENCOUNTER — Other Ambulatory Visit: Payer: Self-pay

## 2019-11-11 ENCOUNTER — Ambulatory Visit
Admission: RE | Admit: 2019-11-11 | Discharge: 2019-11-11 | Disposition: A | Discharge status: Home / Self Care | Payer: No Typology Code available for payment source | Source: Ambulatory Visit | Attending: Obstetrics and Gynecology | Admitting: Obstetrics and Gynecology

## 2019-11-11 ENCOUNTER — Other Ambulatory Visit: Payer: Self-pay | Admitting: Obstetrics and Gynecology

## 2019-11-11 DIAGNOSIS — N644 Mastodynia: Secondary | ICD-10-CM

## 2020-07-20 ENCOUNTER — Other Ambulatory Visit: Payer: Self-pay

## 2020-07-20 ENCOUNTER — Emergency Department (HOSPITAL_COMMUNITY)
Admission: EM | Admit: 2020-07-20 | Discharge: 2020-07-21 | Disposition: A | Discharge status: Home / Self Care | Payer: No Typology Code available for payment source | Attending: Emergency Medicine | Admitting: Emergency Medicine

## 2020-07-20 ENCOUNTER — Encounter (HOSPITAL_COMMUNITY): Payer: Self-pay

## 2020-07-20 DIAGNOSIS — E119 Type 2 diabetes mellitus without complications: Secondary | ICD-10-CM | POA: Insufficient documentation

## 2020-07-20 DIAGNOSIS — Z7984 Long term (current) use of oral hypoglycemic drugs: Secondary | ICD-10-CM | POA: Insufficient documentation

## 2020-07-20 DIAGNOSIS — R1030 Lower abdominal pain, unspecified: Secondary | ICD-10-CM | POA: Insufficient documentation

## 2020-07-20 DIAGNOSIS — R319 Hematuria, unspecified: Secondary | ICD-10-CM | POA: Insufficient documentation

## 2020-07-20 DIAGNOSIS — R110 Nausea: Secondary | ICD-10-CM | POA: Insufficient documentation

## 2020-07-20 DIAGNOSIS — R300 Dysuria: Secondary | ICD-10-CM | POA: Insufficient documentation

## 2020-07-20 LAB — CBC WITH DIFFERENTIAL/PLATELET
Abs Immature Granulocytes: 0.03 10*3/uL (ref 0.00–0.07)
Basophils Absolute: 0 10*3/uL (ref 0.0–0.1)
Basophils Relative: 0 %
Eosinophils Absolute: 0.1 10*3/uL (ref 0.0–0.5)
Eosinophils Relative: 1 %
HCT: 37.6 % (ref 36.0–46.0)
Hemoglobin: 12.1 g/dL (ref 12.0–15.0)
Immature Granulocytes: 0 %
Lymphocytes Relative: 38 %
Lymphs Abs: 3.8 10*3/uL (ref 0.7–4.0)
MCH: 30 pg (ref 26.0–34.0)
MCHC: 32.2 g/dL (ref 30.0–36.0)
MCV: 93.3 fL (ref 80.0–100.0)
Monocytes Absolute: 0.6 10*3/uL (ref 0.1–1.0)
Monocytes Relative: 6 %
Neutro Abs: 5.4 10*3/uL (ref 1.7–7.7)
Neutrophils Relative %: 55 %
Platelets: 294 10*3/uL (ref 150–400)
RBC: 4.03 MIL/uL (ref 3.87–5.11)
RDW: 14.2 % (ref 11.5–15.5)
WBC: 10 10*3/uL (ref 4.0–10.5)
nRBC: 0 % (ref 0.0–0.2)

## 2020-07-20 LAB — BASIC METABOLIC PANEL
Anion gap: 9 (ref 5–15)
BUN: 7 mg/dL (ref 6–20)
CO2: 24 mmol/L (ref 22–32)
Calcium: 9.3 mg/dL (ref 8.9–10.3)
Chloride: 103 mmol/L (ref 98–111)
Creatinine, Ser: 0.69 mg/dL (ref 0.44–1.00)
GFR, Estimated: >60 mL/min (ref >60)
Glucose, Bld: 151 mg/dL — ABNORMAL HIGH (ref 70–99)
Potassium: 3.7 mmol/L (ref 3.5–5.1)
Sodium: 136 mmol/L (ref 135–145)

## 2020-07-20 LAB — URINALYSIS, ROUTINE W REFLEX MICROSCOPIC
Bacteria, UA: NONE SEEN
Bilirubin Urine: NEGATIVE
Glucose, UA: NEGATIVE mg/dL
Ketones, ur: NEGATIVE mg/dL
Nitrite: NEGATIVE
Protein, ur: 100 mg/dL — AB
Specific Gravity, Urine: 1.015 (ref 1.005–1.030)
WBC, UA: >50 WBC/hpf — ABNORMAL HIGH (ref 0–5)
pH: 6 (ref 5.0–8.0)

## 2020-07-20 LAB — I-STAT BETA HCG BLOOD, ED (MC, WL, AP ONLY): I-stat hCG, quantitative: <5 m[IU]/mL (ref <5)

## 2020-07-20 NOTE — ED Triage Notes (Signed)
Emergency Medicine Provider Triage Evaluation Note  Jean Mays , a 40 y.o. female  was evaluated in triage.  Pt complains of dysuria x 5 days, now with blood in the urine. Also suprpubic abdominal pain and low back pain. Hx of UTI treated several months ago but pain has persisted. No history of kidney stones. Spanish interpreter used. Denies possibility of pregnancy.  Review of Systems  Positive: Dysuria, abdominal pain, back pain Negative: Fever, changes in bowel habits, nausea, vomiting  Physical Exam  BP (!) 117/99 (BP Location: Right Arm)   Pulse 86   Resp 18   SpO2 100%  Gen:   Awake, no distress   HEENT:  Atraumatic  Resp:  Normal effort  Cardiac:  Normal rate  Abd:   Nondistended, nontender, no CVA tenderness  MSK:   Moves extremities without difficulty  Neuro:  Speech clear   Medical Decision Making  Medically screening exam initiated at 8:34 PM.  Appropriate orders placed.  Jean Mays was informed that the remainder of the evaluation will be completed by another provider, this initial triage assessment does not replace that evaluation, and the importance of remaining in the ED until their evaluation is complete.  Clinical Impression     Jean Mays 07/20/20 2039

## 2020-07-20 NOTE — ED Triage Notes (Signed)
Pt has had 5 days of pain with urination, back pain and started urinating blood today. Pt denies fevers or vomiting. Has had nausea.

## 2020-07-21 MED ORDER — CEPHALEXIN 500 MG PO CAPS: 500.0000 mg | ORAL_CAPSULE | Freq: Two times a day (BID) | ORAL | 0 refills | Status: DC

## 2020-07-21 MED ORDER — CEPHALEXIN 250 MG PO CAPS
500.0000 mg | ORAL_CAPSULE | Freq: Once | ORAL | Status: AC
Start: 2020-07-21 — End: 2020-07-21
  Administered 2020-07-21: 500 mg via ORAL
  Filled 2020-07-21: qty 2

## 2020-07-21 NOTE — ED Provider Notes (Signed)
[facility] EMERGENCY DEPARTMENT Provider Note   CSN: [id number] Arrival date & time: 07/20/20  2015     History Chief Complaint  Patient presents with  . Dysuria    Jean Mays is a 40 y.o. female.  Patient presents to the emergency department with a chief complaint of dysuria x5 days.  She states that today she began having blood in the urine.  This prompted her to come to the emergency department.  She reports mild associated nausea, but denies any fever, chills, or vomiting.  She reports mild bilateral flank pain and suprapubic abdominal pain.  She reports having had a UTI several months ago.  Denies any medication allergies.  The history is provided by the patient. No language interpreter was used.       Past Medical History:  Diagnosis Date  . Anemia   . Gestational diabetes     Patient Active Problem List   Diagnosis Date Noted  . Obesity 10/20/2013  . Diabetes mellitus (HCC) 10/18/2013  . Anemia 02/19/2011    Past Surgical History:  Procedure Laterality Date  . NO PAST SURGERIES       OB History    Gravida  5   Para  4   Term  4   Preterm  0   AB  1   Living  4     SAB  1   IAB  0   Ectopic  0   Multiple  0   Live Births  4           Family History  Problem Relation Age of Onset  . Diabetes Mother   . Diabetes Sister     Social History   Tobacco Use  . Smoking status: Never Smoker  . Smokeless tobacco: Never Used  Vaping Use  . Vaping Use: Never used  Substance Use Topics  . Alcohol use: No  . Drug use: No    Home Medications Prior to Admission medications   Medication Sig Start Date End Date Taking? Authorizing Provider  cephALEXin (KEFLEX) 500 MG capsule Take 1 capsule (500 mg total) by mouth 2 (two) times daily. 07/21/20  Yes Roxy Horseman, PA-C  metFORMIN (GLUCOPHAGE) 1000 MG tablet Take 1 tablet (1,000 mg total) by mouth 2 (two) times daily with a meal. 04/03/16   Georgina Quint, MD     Allergies    Patient has no known allergies.  Review of Systems   Review of Systems  All other systems reviewed and are negative.   Physical Exam Updated Vital Signs BP 114/71 (BP Location: Right Arm)   Pulse 80   Temp 98.5 F (36.9 C) (Oral)   Resp 18   SpO2 100%   Physical Exam Vitals and nursing note reviewed.  Constitutional:      General: She is not in acute distress.    Appearance: She is well-developed.  HENT:     Head: Normocephalic and atraumatic.  Eyes:     Conjunctiva/sclera: Conjunctivae normal.  Cardiovascular:     Rate and Rhythm: Normal rate.     Heart sounds: No murmur heard.   Pulmonary:     Effort: Pulmonary effort is normal. No respiratory distress.  Abdominal:     General: There is no distension.  Musculoskeletal:     Cervical back: Neck supple.     Comments: Moves all extremities  Skin:    General: Skin is warm and dry.  Neurological:     Mental Status:  She is alert and oriented to person, place, and time.  Psychiatric:        Mood and Affect: Mood normal.        Behavior: Behavior normal.     ED Results / Procedures / Treatments   Labs (all labs ordered are listed, but only abnormal results are displayed) Labs Reviewed  BASIC METABOLIC PANEL - Abnormal; Notable for the following components:      Result Value   Glucose, Bld 151 (*)    All other components within normal limits  URINALYSIS, ROUTINE W REFLEX MICROSCOPIC - Abnormal; Notable for the following components:   APPearance TURBID (*)    Hgb urine dipstick LARGE (*)    Protein, ur 100 (*)    Leukocytes,Ua LARGE (*)    WBC, UA >50 (*)    All other components within normal limits  CBC WITH DIFFERENTIAL/PLATELET  I-STAT BETA HCG BLOOD, ED ([facility], [facility], [facility] ONLY)    EKG None  Radiology No results found.  Procedures Procedures   Medications Ordered in ED Medications  cephALEXin (KEFLEX) capsule 500 mg (has no administration in time range)    ED Course  I have  reviewed the triage vital signs and the nursing notes.  Pertinent labs & imaging results that were available during my care of the patient were reviewed by me and considered in my medical decision making (see chart for details).    MDM Rules/Calculators/A&P                          Patient here with dysuria, no hematuria.  Urinalysis concerning for infection.  Will treat with Keflex.  Return precautions discussed.  Urine culture sent.  Well appearing.  VSS.  Appears stable for discharge. Final Clinical Impression(s) / ED Diagnoses Final diagnoses:  Dysuria    Rx / DC Orders ED Discharge Orders         Ordered    cephALEXin (KEFLEX) 500 MG capsule  2 times daily        07/21/20 0148           Roxy Horseman, PA-C 07/21/20 0150    Dione Booze, MD 07/21/20 [phone]

## 2020-10-12 ENCOUNTER — Emergency Department (HOSPITAL_BASED_OUTPATIENT_CLINIC_OR_DEPARTMENT_OTHER): Payer: No Typology Code available for payment source

## 2020-10-12 ENCOUNTER — Emergency Department (HOSPITAL_BASED_OUTPATIENT_CLINIC_OR_DEPARTMENT_OTHER)
Admission: EM | Admit: 2020-10-12 | Discharge: 2020-10-12 | Disposition: A | Discharge status: Home / Self Care | Payer: No Typology Code available for payment source | Attending: Emergency Medicine | Admitting: Emergency Medicine

## 2020-10-12 ENCOUNTER — Other Ambulatory Visit: Payer: Self-pay

## 2020-10-12 ENCOUNTER — Encounter (HOSPITAL_BASED_OUTPATIENT_CLINIC_OR_DEPARTMENT_OTHER): Payer: Self-pay

## 2020-10-12 DIAGNOSIS — Y92410 Unspecified street and highway as the place of occurrence of the external cause: Secondary | ICD-10-CM | POA: Diagnosis not present

## 2020-10-12 DIAGNOSIS — R519 Headache, unspecified: Secondary | ICD-10-CM | POA: Insufficient documentation

## 2020-10-12 DIAGNOSIS — E119 Type 2 diabetes mellitus without complications: Secondary | ICD-10-CM | POA: Diagnosis not present

## 2020-10-12 DIAGNOSIS — M25512 Pain in left shoulder: Secondary | ICD-10-CM | POA: Diagnosis not present

## 2020-10-12 DIAGNOSIS — Z7984 Long term (current) use of oral hypoglycemic drugs: Secondary | ICD-10-CM | POA: Insufficient documentation

## 2020-10-12 DIAGNOSIS — S0512XA Contusion of eyeball and orbital tissues, left eye, initial encounter: Secondary | ICD-10-CM | POA: Diagnosis not present

## 2020-10-12 DIAGNOSIS — S0592XA Unspecified injury of left eye and orbit, initial encounter: Secondary | ICD-10-CM | POA: Diagnosis present

## 2020-10-12 MED ORDER — BACITRACIN ZINC 500 UNIT/GM EX OINT: TOPICAL_OINTMENT | Freq: Once | CUTANEOUS | Status: AC

## 2020-10-12 MED ORDER — NAPROXEN 500 MG PO TABS: 500.0000 mg | ORAL_TABLET | Freq: Two times a day (BID) | ORAL | 0 refills | Status: DC

## 2020-10-12 MED ORDER — KETOROLAC TROMETHAMINE 30 MG/ML IJ SOLN
30.0000 mg | Freq: Once | INTRAMUSCULAR | Status: AC
  Administered 2020-10-12: 30 mg via INTRAMUSCULAR
  Filled 2020-10-12: qty 1

## 2020-10-12 MED ORDER — METHOCARBAMOL 500 MG PO TABS: 500.0000 mg | ORAL_TABLET | Freq: Two times a day (BID) | ORAL | 0 refills | Status: DC
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
## 2021-02-21 IMAGING — US US BREAST*L* LIMITED INC AXILLA
1 series · 5 of 5 positions shown · non-contrast
Comparison: Previous exam(s).

CLINICAL DATA: 39-year-old female intermittent focal pain in the
with her doctor they noted a small amount of nipple discharge from
the left breast. This was tested and was negative. She did not
previously note any nipple discharge, and has not noticed any since.

EXAM:
DIGITAL DIAGNOSTIC BILATERAL MAMMOGRAM WITH TOMO AND CAD; ULTRASOUND
LEFT BREAST LIMITED

[Series 1: us breast*left* limited inc axilla · 0.06mm/px · 5 of 5 slices shown]
[im 1/5]
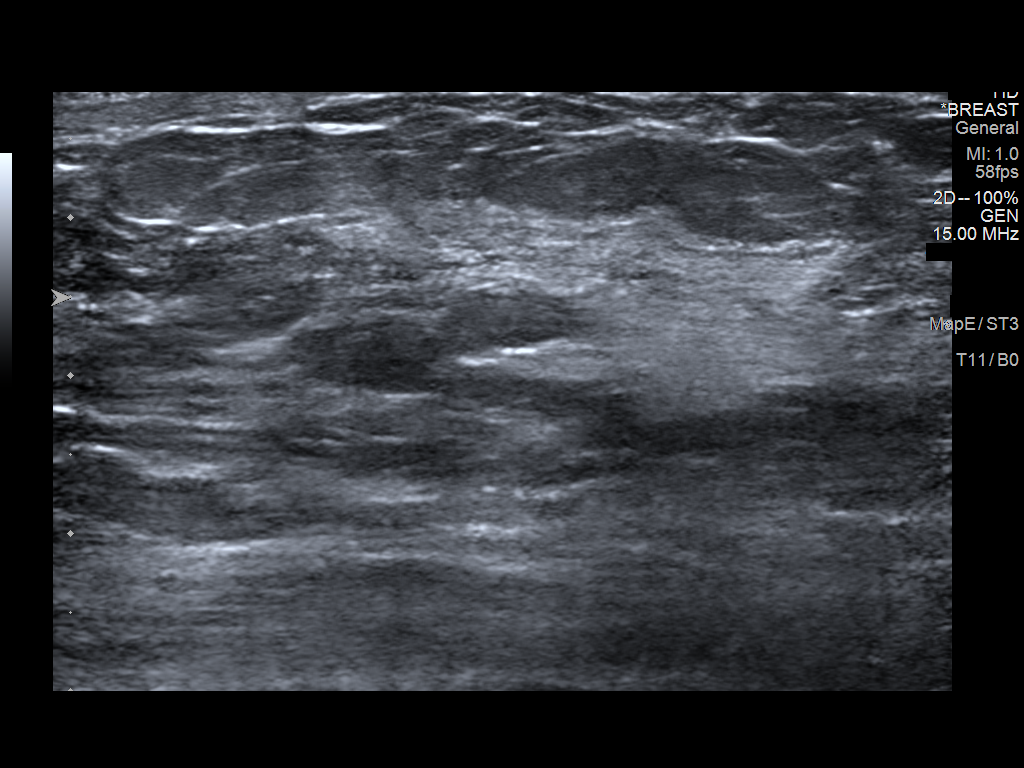
[im 2/5]
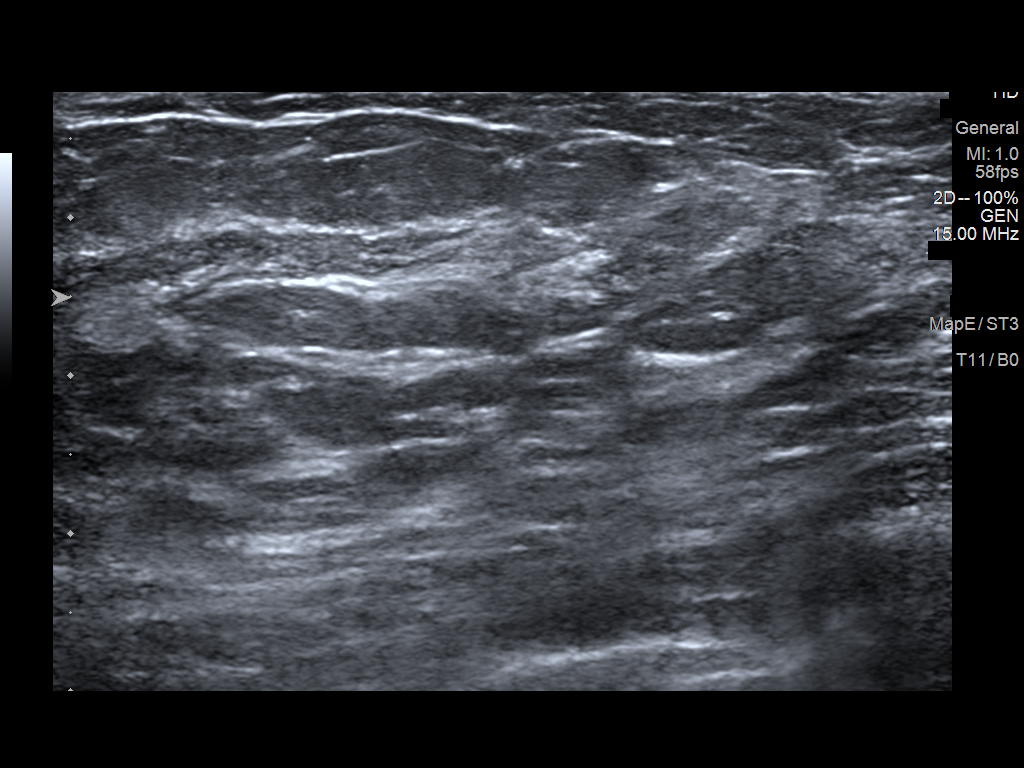
[im 3/5]
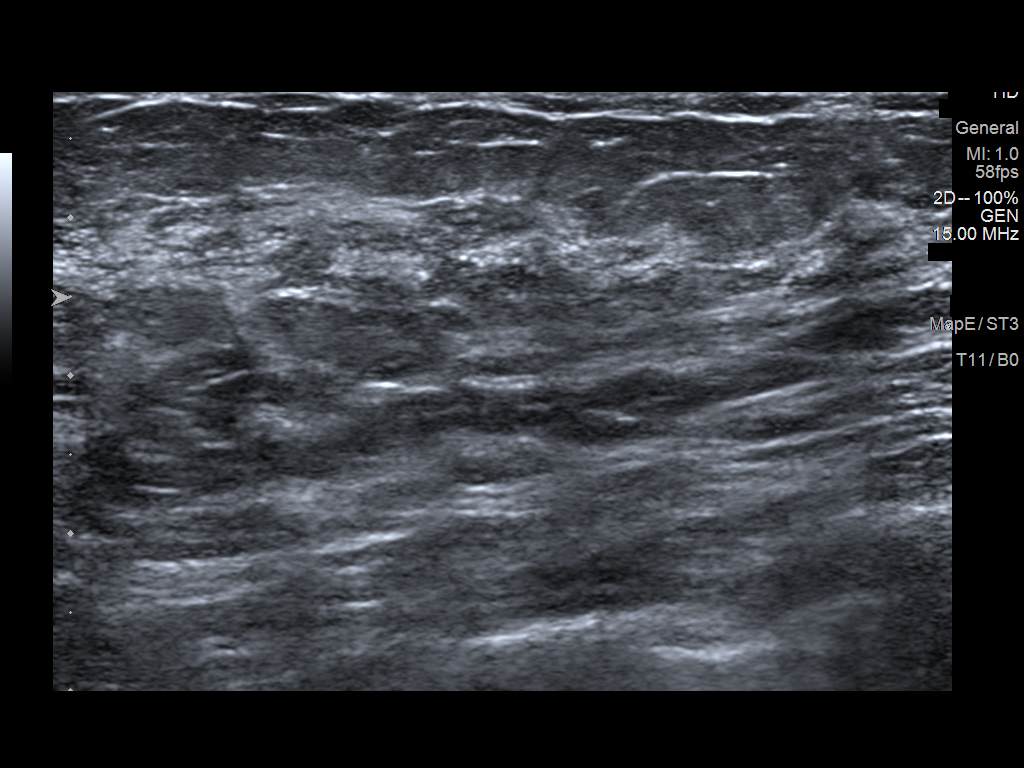
[im 4/5]
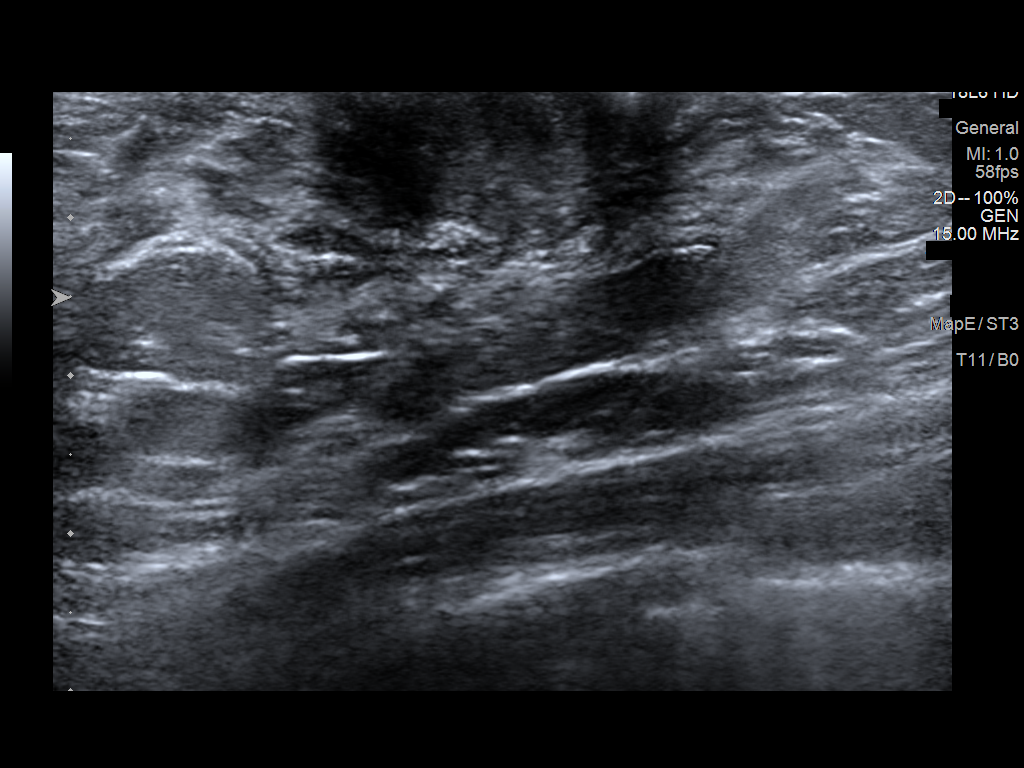
[im 5/5]
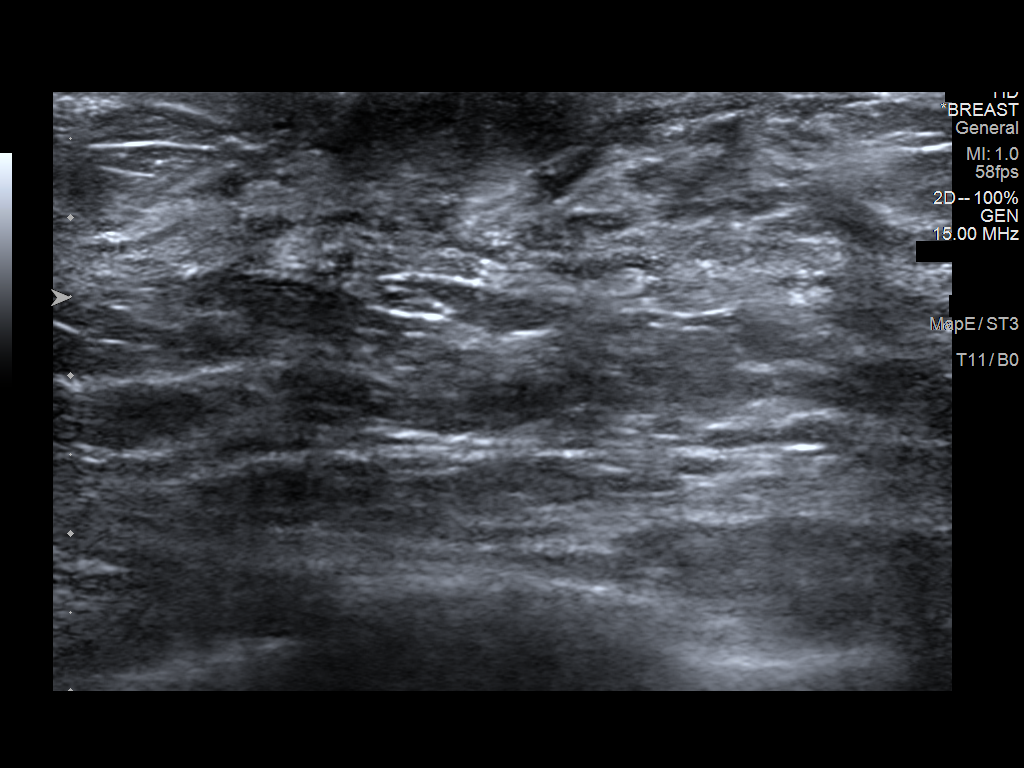

[5 of 5 positions shown; findings below may reference images not displayed]

ACR Breast Density Category c: The breast tissue is heterogeneously
dense, which may obscure small masses.
FINDINGS: A BB has been placed along the lateral aspect of the left breast
indicating the tender site of concern. There are no suspicious
mammographic findings deep to the marker. No suspicious
calcifications, masses or areas of distortion are seen in the
bilateral breasts.

Mammographic images were processed with CAD.

A tiny amount of clear discharge could be elicited from the left
nipple with manual expression. No discharge could be elicited from
the right nipple.

Ultrasound targeted to the left breast at 2 o'clock, 3 cm from the
nipple at the tender site, demonstrates normal fibroglandular
tissue. Ultrasound of the retroareolar left breast demonstrates
normal fibroglandular tissue. No masses or suspicious areas of
shadowing are identified.
IMPRESSION: 1. No mammographic or targeted sonographic abnormalities are
identified in the lateral left breast at the tender site of concern.

2. No findings in the retroareolar left breast to account for the
nipple discharge seen on physical exam.

3.  No mammographic evidence of malignancy in the bilateral breasts.

RECOMMENDATION:
1. Clinical follow-up recommended for the tender area of concern in
the left breast. Any further workup should be based on clinical
grounds.

2. Further management of nipple discharge should be based on
clinical assessment as this has only been noticed once on physical
exam. The patient was advised to alert her doctor if she experiences
spontaneous unilateral bloody or clear nipple discharge from a
single duct only.

3.  Screening mammogram in one year.(Code:8D-V-OCR)

I have discussed the findings and recommendations with the patient.
If applicable, a reminder letter will be sent to the patient
regarding the next appointment.

BI-RADS CATEGORY  1: Negative.

## 2021-05-28 ENCOUNTER — Other Ambulatory Visit: Payer: Self-pay | Admitting: Obstetrics and Gynecology

## 2021-05-28 DIAGNOSIS — Z1231 Encounter for screening mammogram for malignant neoplasm of breast: Secondary | ICD-10-CM

## 2021-07-31 ENCOUNTER — Ambulatory Visit: Payer: No Typology Code available for payment source

## 2021-11-15 ENCOUNTER — Ambulatory Visit
Admission: RE | Admit: 2021-11-15 | Discharge: 2021-11-15 | Disposition: A | Discharge status: Home / Self Care | Payer: No Typology Code available for payment source | Source: Ambulatory Visit | Attending: Obstetrics and Gynecology | Admitting: Obstetrics and Gynecology

## 2021-11-15 ENCOUNTER — Ambulatory Visit: Payer: Self-pay | Admitting: *Deleted

## 2021-11-15 VITALS — BP 118/80 | Wt 186.0 lb

## 2021-11-15 DIAGNOSIS — Z01419 Encounter for gynecological examination (general) (routine) without abnormal findings: Secondary | ICD-10-CM

## 2021-11-15 DIAGNOSIS — Z1231 Encounter for screening mammogram for malignant neoplasm of breast: Secondary | ICD-10-CM

## 2021-11-15 NOTE — Patient Instructions (Signed)
Explained breast self awareness with Jean Mays. Pap smear completed today. Let her know BCCCP will cover Pap smears and HPV typing every 5 years unless has a history of abnormal Pap smears. Referred patient to the Breast Center of [facility] for a screening mammogram on mobile unit. Appointment scheduled Thursday, November 15, 2021 at 1400. Patient aware of appointment and will be there. Let patient know will follow up with her within the next couple weeks with results of Pap smear by phone. Informed patient that the Breast Center will follow up with her within the next couple of weeks with results of her mammogram by letter or phone. Jean Mays verbalized understanding.  Kyrstyn Greear, Kathaleen Maser, RN 1:25 PM

## 2021-11-15 NOTE — Progress Notes (Signed)
Jean Mays is a 41 y.o. [phone] female who presents to [facility] clinic today with no complaints.    Pap Smear: Pap smear not completed today. Last Pap smear was in June 2020 at [facility]. and was normal. Per patient has no history of an abnormal Pap smear. Last Pap smear result is not available in Epic. Pap smear results from 2015 are available in Epic.   Physical exam: Breasts Right breast greater than left breast that per patient is normal for her. No skin abnormalities bilateral breasts. No nipple retraction bilateral breasts. No nipple discharge bilateral breasts. No lymphadenopathy. No lumps palpated bilateral breasts. No complaints of pain or tenderness on exam.      MS DIGITAL DIAG TOMO BILAT  Result Date: 11/11/2019 CLINICAL DATA:  41 year old female intermittent focal pain in the lateral left breast. Patient also states that upon physical exam with her doctor they noted a small amount of nipple discharge from the left breast. This was tested and was negative. She did not previously note any nipple discharge, and has not noticed any since. EXAM: DIGITAL DIAGNOSTIC BILATERAL MAMMOGRAM WITH TOMO AND CAD; ULTRASOUND LEFT BREAST LIMITED COMPARISON:  Previous exam(s). ACR Breast Density Category c: The breast tissue is heterogeneously dense, which may obscure small masses. FINDINGS: A BB has been placed along the lateral aspect of the left breast indicating the tender site of concern. There are no suspicious mammographic findings deep to the marker. No suspicious calcifications, masses or areas of distortion are seen in the bilateral breasts. Mammographic images were processed with CAD. A tiny amount of clear discharge could be elicited from the left nipple with manual expression. No discharge could be elicited from the right nipple. Ultrasound targeted to the left breast at 2 o'clock, 3 cm from the nipple at the tender site, demonstrates normal fibroglandular tissue. Ultrasound of  the retroareolar left breast demonstrates normal fibroglandular tissue. No masses or suspicious areas of shadowing are identified. IMPRESSION: 1. No mammographic or targeted sonographic abnormalities are identified in the lateral left breast at the tender site of concern. 2. No findings in the retroareolar left breast to account for the nipple discharge seen on physical exam. 3.  No mammographic evidence of malignancy in the bilateral breasts. RECOMMENDATION: 1. Clinical follow-up recommended for the tender area of concern in the left breast. Any further workup should be based on clinical grounds. 2. Further management of nipple discharge should be based on clinical assessment as this has only been noticed once on physical exam. The patient was advised to alert her doctor if she experiences spontaneous unilateral bloody or clear nipple discharge from a single duct only. 3.  Screening mammogram in one year.(Code:SM-B-01Y) I have discussed the findings and recommendations with the patient. If applicable, a reminder letter will be sent to the patient regarding the next appointment. BI-RADS CATEGORY  1: Negative. Electronically Signed   By: Frederico Hamman M.D.   On: 11/11/2019 16:26     Pelvic/Bimanual Ext Genitalia No lesions, no swelling and no discharge observed on external genitalia.        Vagina Vagina pink and normal texture. No lesions or discharge observed in vagina.        Cervix Cervix is present. Cervix reddened around os and of normal texture. Cervix friable. No discharge observed.    Uterus Uterus is present and palpable. Uterus in normal position and normal size.        Adnexae Bilateral ovaries present and palpable. No tenderness  on palpation.         Rectovaginal No rectal exam completed today since patient had no rectal complaints. No skin abnormalities observed on exam.     Smoking History: Patient has never smoked.   Patient Navigation: Patient education provided. Access  to services provided for patient through [place] program. Spanish interpreter Thomasene Mohair from [facility] provided.    Breast and Cervical Cancer Risk Assessment: Patient does not have family history of breast cancer, known genetic mutations, or radiation treatment to the chest before age 66. Patient does not have history of cervical dysplasia, immunocompromised, or DES exposure in-utero.  Risk Assessment     Risk Scores       11/15/2021 10/21/2019   Last edited by: Meryl Dare, CMA Mila Homer, RN   5-year risk: 0.4 % 0.3 %   Lifetime risk: 5 % 5.1 %            A: BCCCP exam with pap smear No complaints.  P: Referred patient to the [facility] of [facility] for a screening mammogram on mobile unit. Appointment scheduled Thursday, November 15, 2021 at 1400.  Priscille Heidelberg, RN 11/15/2021 1:25 PM

## 2021-11-20 LAB — CYTOLOGY - PAP
Comment: NEGATIVE
Comment: NEGATIVE
Comment: NEGATIVE
Diagnosis: UNDETERMINED — AB
HPV 16: NEGATIVE
HPV 18 / 45: NEGATIVE
High risk HPV: POSITIVE — AB

## 2021-11-22 ENCOUNTER — Telehealth: Payer: Self-pay

## 2021-11-22 NOTE — Telephone Encounter (Signed)
11/21/2021-Via Delorise Royals, Spanish Interpreter [facility]) Patient informed pap results, ASC-US with +HPV, needs colposcopy. Patient verbalized understanding. Referral information sent to [facility].

## 2022-01-16 ENCOUNTER — Ambulatory Visit: Payer: No Typology Code available for payment source | Admitting: Obstetrics and Gynecology

## 2022-01-31 ENCOUNTER — Ambulatory Visit (INDEPENDENT_AMBULATORY_CARE_PROVIDER_SITE_OTHER): Payer: Self-pay | Admitting: Obstetrics and Gynecology

## 2022-01-31 ENCOUNTER — Encounter: Payer: Self-pay | Admitting: Obstetrics and Gynecology

## 2022-01-31 ENCOUNTER — Other Ambulatory Visit (HOSPITAL_COMMUNITY)
Admission: RE | Admit: 2022-01-31 | Discharge: 2022-01-31 | Disposition: A | Discharge status: Home / Self Care | Payer: Self-pay | Source: Ambulatory Visit | Attending: Obstetrics and Gynecology | Admitting: Obstetrics and Gynecology

## 2022-01-31 VITALS — BP 136/86 | HR 88 | Ht 64.0 in | Wt 184.0 lb

## 2022-01-31 DIAGNOSIS — R87610 Atypical squamous cells of undetermined significance on cytologic smear of cervix (ASC-US): Secondary | ICD-10-CM | POA: Insufficient documentation

## 2022-01-31 DIAGNOSIS — R87810 Cervical high risk human papillomavirus (HPV) DNA test positive: Secondary | ICD-10-CM

## 2022-01-31 NOTE — Progress Notes (Signed)
Patient given informed consent, signed copy in the chart, time out was performed.  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    Placed in lithotomy position. Cervix viewed with speculum and colposcope after application of acetic acid and lugol's solution. Momnsel's solution applied to achieve hemostasis.  Colposcopy adequate?  yes Acetowhite lesions?  Mild acetowhite lesions at 11 o'clock, nonstaining lesions with lugol's solution noted at 10-11 oclock and at 6 o'clock Punctation?  none Mosaicism?  none Abnormal vasculature?   none Biopsies?yes at 6 o'clock and at 11 o'clock ECC? no  COMMENTS: Patient was given post procedure instructions.  She will return in 2 weeks for results.  Griffin Basil, MD

## 2022-02-04 LAB — SURGICAL PATHOLOGY

## 2022-02-20 ENCOUNTER — Telehealth: Payer: Self-pay | Admitting: Family Medicine

## 2022-02-20 NOTE — Telephone Encounter (Signed)
Spanish/Patient was suppose to receive call regarding last appt, she would like her results.

## 2022-02-20 NOTE — Telephone Encounter (Signed)
Called pt w/interpreter [employer] and provided Colpo results. Pt states she has appt next week in office and wants to know what it is for. I advised the appt is to review Colpo results with the doctor. She voiced understanding.

## 2022-03-08 ENCOUNTER — Encounter: Payer: Self-pay | Admitting: Obstetrics and Gynecology

## 2022-03-08 ENCOUNTER — Ambulatory Visit (INDEPENDENT_AMBULATORY_CARE_PROVIDER_SITE_OTHER): Payer: Self-pay | Admitting: Obstetrics and Gynecology

## 2022-03-08 VITALS — BP 129/83 | HR 81 | Ht 62.0 in | Wt 190.9 lb

## 2022-03-08 DIAGNOSIS — N870 Mild cervical dysplasia: Secondary | ICD-10-CM

## 2022-03-08 NOTE — Progress Notes (Signed)
Pt reports that her bleeding stopped 4 days ago but still having some throbbing pain in lower abdomen.

## 2022-03-08 NOTE — Progress Notes (Signed)
  CC: colposcopy follow up Subjective:    Patient ID: Jean Mays, female    DOB: 05-Nov-1980, 41 y.o.   MRN: [id number]  HPI Pt seen to discuss biopsy results. 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  Discussed need for repap in 1 year.  Pt notes agreement.   Review of Systems     Objective:   Physical Exam Vitals:   03/08/22 0919  BP: 129/83  Pulse: 81         Assessment & Plan:   1. Cervical dysplasia, mild CIN 1 discussed with patient.   HPV discussed in detail with interpreter present.  Discussed need for repap in 1 year with the hope her immune system will clear the lesion.  I spent 10 minutes dedicated to the care of this patient including previsit review of records, face to face time with the patient discussing biopsy results and post visit testing.     Warden Fillers, MD Faculty Attending, Center for [facility]

## 2022-12-17 ENCOUNTER — Other Ambulatory Visit: Payer: Self-pay

## 2022-12-17 DIAGNOSIS — Z1231 Encounter for screening mammogram for malignant neoplasm of breast: Secondary | ICD-10-CM

## 2022-12-31 ENCOUNTER — Emergency Department (HOSPITAL_COMMUNITY)
Admission: EM | Admit: 2022-12-31 | Discharge: 2023-01-01 | Disposition: A | Discharge status: Home / Self Care | Payer: No Typology Code available for payment source | Attending: Emergency Medicine | Admitting: Emergency Medicine

## 2022-12-31 ENCOUNTER — Other Ambulatory Visit: Payer: Self-pay

## 2022-12-31 DIAGNOSIS — S91052A Open bite, left ankle, initial encounter: Secondary | ICD-10-CM | POA: Insufficient documentation

## 2022-12-31 DIAGNOSIS — W5911XA Bitten by nonvenomous snake, initial encounter: Secondary | ICD-10-CM | POA: Insufficient documentation

## 2022-12-31 DIAGNOSIS — R791 Abnormal coagulation profile: Secondary | ICD-10-CM | POA: Insufficient documentation

## 2022-12-31 DIAGNOSIS — Z23 Encounter for immunization: Secondary | ICD-10-CM | POA: Insufficient documentation

## 2022-12-31 LAB — CBC WITH DIFFERENTIAL/PLATELET
Abs Immature Granulocytes: 0.03 10*3/uL (ref 0.00–0.07)
Basophils Absolute: 0 10*3/uL (ref 0.0–0.1)
Basophils Relative: 0 %
Eosinophils Absolute: 0.1 10*3/uL (ref 0.0–0.5)
Eosinophils Relative: 1 %
HCT: 35.4 % — ABNORMAL LOW (ref 36.0–46.0)
Hemoglobin: 11.5 g/dL — ABNORMAL LOW (ref 12.0–15.0)
Immature Granulocytes: 0 %
Lymphocytes Relative: 36 %
Lymphs Abs: 3.4 10*3/uL (ref 0.7–4.0)
MCH: 28.3 pg (ref 26.0–34.0)
MCHC: 32.5 g/dL (ref 30.0–36.0)
MCV: 87.2 fL (ref 80.0–100.0)
Monocytes Absolute: 0.6 10*3/uL (ref 0.1–1.0)
Monocytes Relative: 6 %
Neutro Abs: 5.3 10*3/uL (ref 1.7–7.7)
Neutrophils Relative %: 57 %
Platelets: 274 10*3/uL (ref 150–400)
RBC: 4.06 MIL/uL (ref 3.87–5.11)
RDW: 14.4 % (ref 11.5–15.5)
WBC: 9.4 10*3/uL (ref 4.0–10.5)
nRBC: 0 % (ref 0.0–0.2)

## 2022-12-31 LAB — PROTIME-INR
INR: 0.9 (ref 0.8–1.2)
Prothrombin Time: 12.8 s (ref 11.4–15.2)

## 2022-12-31 LAB — BASIC METABOLIC PANEL
Anion gap: 11 (ref 5–15)
BUN: 10 mg/dL (ref 6–20)
CO2: 24 mmol/L (ref 22–32)
Calcium: 9.5 mg/dL (ref 8.9–10.3)
Chloride: 102 mmol/L (ref 98–111)
Creatinine, Ser: 0.8 mg/dL (ref 0.44–1.00)
GFR, Estimated: >60 mL/min (ref >60)
Glucose, Bld: 185 mg/dL — ABNORMAL HIGH (ref 70–99)
Potassium: 3.6 mmol/L (ref 3.5–5.1)
Sodium: 137 mmol/L (ref 135–145)

## 2022-12-31 LAB — HCG, SERUM, QUALITATIVE: Preg, Serum: NEGATIVE

## 2022-12-31 LAB — FIBRINOGEN: Fibrinogen: 343 mg/dL (ref 210–475)

## 2022-12-31 MED ORDER — TETANUS-DIPHTH-ACELL PERTUSSIS 5-2.5-18.5 LF-MCG/0.5 IM SUSY
0.5000 mL | PREFILLED_SYRINGE | Freq: Once | INTRAMUSCULAR | Status: AC
  Administered 2022-12-31: 0.5 mL via INTRAMUSCULAR

## 2022-12-31 NOTE — ED Triage Notes (Signed)
Pt arrives to ED c/o copperhead bite to left foot ago. Pt with swelling and pain to extremity but c/o nothing else.

## 2023-01-01 ENCOUNTER — Emergency Department (HOSPITAL_COMMUNITY): Payer: No Typology Code available for payment source

## 2023-01-01 LAB — CBC WITH DIFFERENTIAL/PLATELET
Abs Immature Granulocytes: 0.02 10*3/uL (ref 0.00–0.07)
Abs Immature Granulocytes: 0.04 10*3/uL (ref 0.00–0.07)
Basophils Absolute: 0 10*3/uL (ref 0.0–0.1)
Basophils Absolute: 0 10*3/uL (ref 0.0–0.1)
Basophils Relative: 0 %
Basophils Relative: 0 %
Eosinophils Absolute: 0.1 10*3/uL (ref 0.0–0.5)
Eosinophils Absolute: 0.1 10*3/uL (ref 0.0–0.5)
Eosinophils Relative: 1 %
Eosinophils Relative: 1 %
HCT: 34.4 % — ABNORMAL LOW (ref 36.0–46.0)
HCT: 33.6 % — ABNORMAL LOW (ref 36.0–46.0)
Hemoglobin: 11 g/dL — ABNORMAL LOW (ref 12.0–15.0)
Hemoglobin: 11 g/dL — ABNORMAL LOW (ref 12.0–15.0)
Immature Granulocytes: 0 %
Immature Granulocytes: 0 %
Lymphocytes Relative: 39 %
Lymphocytes Relative: 32 %
Lymphs Abs: 3.3 10*3/uL (ref 0.7–4.0)
Lymphs Abs: 2.9 10*3/uL (ref 0.7–4.0)
MCH: 28.9 pg (ref 26.0–34.0)
MCH: 28.8 pg (ref 26.0–34.0)
MCHC: 32 g/dL (ref 30.0–36.0)
MCHC: 32.7 g/dL (ref 30.0–36.0)
MCV: 90.3 fL (ref 80.0–100.0)
MCV: 88 fL (ref 80.0–100.0)
Monocytes Absolute: 0.6 10*3/uL (ref 0.1–1.0)
Monocytes Absolute: 0.6 10*3/uL (ref 0.1–1.0)
Monocytes Relative: 7 %
Monocytes Relative: 7 %
Neutro Abs: 4.5 10*3/uL (ref 1.7–7.7)
Neutro Abs: 5.5 10*3/uL (ref 1.7–7.7)
Neutrophils Relative %: 53 %
Neutrophils Relative %: 60 %
Platelets: 246 10*3/uL (ref 150–400)
Platelets: 251 10*3/uL (ref 150–400)
RBC: 3.81 MIL/uL — ABNORMAL LOW (ref 3.87–5.11)
RBC: 3.82 MIL/uL — ABNORMAL LOW (ref 3.87–5.11)
RDW: 14.5 % (ref 11.5–15.5)
RDW: 14.5 % (ref 11.5–15.5)
WBC: 8.6 10*3/uL (ref 4.0–10.5)
WBC: 9.1 10*3/uL (ref 4.0–10.5)
nRBC: 0 % (ref 0.0–0.2)
nRBC: 0 % (ref 0.0–0.2)

## 2023-01-01 LAB — PROTIME-INR
INR: 0.9 (ref 0.8–1.2)
INR: 1 (ref 0.8–1.2)
Prothrombin Time: 12.7 s (ref 11.4–15.2)
Prothrombin Time: 13 s (ref 11.4–15.2)

## 2023-01-01 LAB — FIBRINOGEN
Fibrinogen: 298 mg/dL (ref 210–475)
Fibrinogen: 300 mg/dL (ref 210–475)

## 2023-01-01 MED ORDER — OXYCODONE-ACETAMINOPHEN 5-325 MG PO TABS
1.0000 | ORAL_TABLET | ORAL | 0 refills | Status: DC | PRN
Start: 2023-01-01 — End: 2023-04-22

## 2023-01-01 MED ORDER — HYDROMORPHONE HCL 1 MG/ML IJ SOLN
1.0000 mg | Freq: Once | INTRAMUSCULAR | Status: AC
  Administered 2023-01-01: 1 mg via INTRAVENOUS
  Filled 2023-01-01: qty 1

## 2023-01-01 MED ORDER — IBUPROFEN 800 MG PO TABS: 800.0000 mg | ORAL_TABLET | Freq: Three times a day (TID) | ORAL | 0 refills | Status: AC

## 2023-01-01 MED ORDER — OXYCODONE-ACETAMINOPHEN 5-325 MG PO TABS
1.0000 | ORAL_TABLET | ORAL | 0 refills | Status: DC | PRN
Start: 2023-01-01 — End: 2023-01-01

## 2023-01-01 MED ORDER — KETOROLAC TROMETHAMINE 30 MG/ML IJ SOLN
15.0000 mg | Freq: Once | INTRAMUSCULAR | Status: AC
  Administered 2023-01-01: 15 mg via INTRAVENOUS
  Filled 2023-01-01: qty 1

## 2023-01-01 MED ORDER — CEPHALEXIN 500 MG PO CAPS: 500.0000 mg | ORAL_CAPSULE | Freq: Four times a day (QID) | ORAL | 0 refills | Status: DC

## 2023-01-01 MED ORDER — OXYCODONE-ACETAMINOPHEN 5-325 MG PO TABS
1.0000 | ORAL_TABLET | Freq: Once | ORAL | Status: AC
  Administered 2023-01-01: 1 via ORAL
  Filled 2023-01-01: qty 1

## 2023-01-01 NOTE — ED Provider Notes (Signed)
[place] EMERGENCY DEPARTMENT AT [facility] Provider Note   CSN: [id number] Arrival date & time: 12/31/22  2103     History  Chief Complaint  Patient presents with   Snake Bite    Jean Mays is a 42 y.o. female.  42 year old female who presents the ER today secondary to a snakebite to her right medial ankle with mild swelling.  Happened around 2030 on October 1.  I saw the patient around 2300.  Has already had labs checked.  His only complaint of pain at this time.  She has no other bleeding issues, new rashes, progression of the swelling.  Received tetanus in triage.        Home Medications Prior to Admission medications   Medication Sig Start Date End Date Taking? Authorizing Provider  ibuprofen (ADVIL) 800 MG tablet Take 1 tablet (800 mg total) by mouth 3 (three) times daily. 01/01/23  Yes Yessenia Maillet, Barbara Cower, MD  cephALEXin (KEFLEX) 500 MG capsule Take 1 capsule (500 mg total) by mouth 4 (four) times daily. 01/01/23   Doninique Lwin, Barbara Cower, MD  lisinopril (ZESTRIL) 10 MG tablet Take 10 mg by mouth daily.    [provider]  metFORMIN (GLUCOPHAGE) 1000 MG tablet Take 1 tablet (1,000 mg total) by mouth 2 (two) times daily with a meal. 04/03/16   Sagardia, Eilleen Kempf, MD  oxyCODONE-acetaminophen (PERCOCET) 5-325 MG tablet Take 1-2 tablets by mouth every 4 (four) hours as needed. 01/01/23   Arthella Headings, Barbara Cower, MD      Allergies    Patient has no known allergies.    Review of Systems   Review of Systems  Physical Exam Updated Vital Signs BP 129/74   Pulse 81   Temp 97.9 F (36.6 C)   Resp 18   Ht 5\' 2"  (1.575 m)   Wt 86.6 kg   SpO2 99%   BMI 34.93 kg/m  Physical Exam Vitals and nursing note reviewed.  Constitutional:      Appearance: She is well-developed.  HENT:     Head: Normocephalic and atraumatic.     Mouth/Throat:     Mouth: Mucous membranes are moist.     Pharynx: Oropharynx is clear.  Eyes:     Pupils: Pupils are equal, round, and reactive to  light.  Cardiovascular:     Rate and Rhythm: Normal rate and regular rhythm.  Pulmonary:     Effort: No respiratory distress.     Breath sounds: No stridor.  Abdominal:     General: There is no distension.  Musculoskeletal:     Cervical back: Normal range of motion.  Skin:    General: Skin is warm and dry.     Comments: Mild edema to her right ankle, no erythema, bleeding, petechiae, purpura, ecchymosis.  No erythema.  She did show me a picture of the snake and I cannot 100% positively identified as a copperhead however does have markings similar to a copperhead.  Neurological:     General: No focal deficit present.     Mental Status: She is alert.     ED Results / Procedures / Treatments   Labs (all labs ordered are listed, but only abnormal results are displayed) Labs Reviewed  BASIC METABOLIC PANEL - Abnormal; Notable for the following components:      Result Value   Glucose, Bld 185 (*)    All other components within normal limits  CBC WITH DIFFERENTIAL/PLATELET - Abnormal; Notable for the following components:   Hemoglobin 11.5 (*)  HCT 35.4 (*)    All other components within normal limits  CBC WITH DIFFERENTIAL/PLATELET - Abnormal; Notable for the following components:   RBC 3.81 (*)    Hemoglobin 11.0 (*)    HCT 34.4 (*)    All other components within normal limits  CBC WITH DIFFERENTIAL/PLATELET - Abnormal; Notable for the following components:   RBC 3.82 (*)    Hemoglobin 11.0 (*)    HCT 33.6 (*)    All other components within normal limits  PROTIME-INR  PROTIME-INR  PROTIME-INR  FIBRINOGEN  FIBRINOGEN  FIBRINOGEN  HCG, SERUM, QUALITATIVE    EKG EKG Interpretation Date/Time:  Tuesday December 31 2022 22:03:34 EDT Ventricular Rate:  91 PR Interval:  178 QRS Duration:  86 QT Interval:  346 QTC Calculation: 425 R Axis:   83  Text Interpretation: Normal sinus rhythm Cannot rule out Anterior infarct , age undetermined Abnormal ECG No previous ECGs  available Confirmed by Kennis Carina [phone]) on 01/02/2023 11:42:59 AM  Radiology No results found.  Procedures Procedures    Medications Ordered in ED Medications  Tdap (BOOSTRIX) injection 0.5 mL (0.5 mLs Intramuscular Given 12/31/22 2346)  HYDROmorphone (DILAUDID) injection 1 mg (1 mg Intravenous Given 01/01/23 0034)  oxyCODONE-acetaminophen (PERCOCET/ROXICET) 5-325 MG per tablet 1 tablet (1 tablet Oral Given 01/01/23 0516)  ketorolac (TORADOL) 30 MG/ML injection 15 mg (15 mg Intravenous Given 01/01/23 6045)    ED Course/ Medical Decision Making/ A&P                                 Medical Decision Making Amount and/or Complexity of Data Reviewed Radiology: ordered.  Risk Prescription drug management.  Tetanus already updated.  Wound looks okay and no significant erythema or swelling at this time.  Initial labs are reassuring.  Will hobs for 8 hours after the bite and recheck labs. Multiple lab rechecks were negative.  Discussed with poison control they stated 8 hours was enough and that a small drop in the fibrinogen and platelets were not a worry but more so if they were a significant drop such as more than 50%.  X-ray done to rule out foreign body secondary to persistent pain.  Will DC on short course of pain medication.    Final Clinical Impression(s) / ED Diagnoses Final diagnoses:  Snake bite, initial encounter    Rx / DC Orders ED Discharge Orders          Ordered    oxyCODONE-acetaminophen (PERCOCET) 5-325 MG tablet  Every 4 hours PRN,   Status:  Discontinued        01/01/23 0518    oxyCODONE-acetaminophen (PERCOCET) 5-325 MG tablet  Every 4 hours PRN        01/01/23 0519    ibuprofen (ADVIL) 800 MG tablet  3 times daily        01/01/23 0609    cephALEXin (KEFLEX) 500 MG capsule  4 times daily        01/01/23 0609              Jadamarie Butson, Barbara Cower, MD 01/03/23 [id number]

## 2023-02-06 ENCOUNTER — Inpatient Hospital Stay: Admission: RE | Admit: 2023-02-06 | Payer: No Typology Code available for payment source | Source: Ambulatory Visit

## 2023-02-06 ENCOUNTER — Ambulatory Visit: Payer: No Typology Code available for payment source

## 2023-04-22 ENCOUNTER — Ambulatory Visit: Payer: Self-pay | Admitting: *Deleted

## 2023-04-22 ENCOUNTER — Ambulatory Visit
Admission: RE | Admit: 2023-04-22 | Discharge: 2023-04-22 | Disposition: A | Discharge status: Home / Self Care | Payer: No Typology Code available for payment source | Source: Ambulatory Visit | Attending: Obstetrics and Gynecology | Admitting: Obstetrics and Gynecology

## 2023-04-22 VITALS — Wt 188.3 lb

## 2023-04-22 DIAGNOSIS — Z1231 Encounter for screening mammogram for malignant neoplasm of breast: Secondary | ICD-10-CM

## 2023-04-22 DIAGNOSIS — Z1211 Encounter for screening for malignant neoplasm of colon: Secondary | ICD-10-CM

## 2023-04-22 DIAGNOSIS — Z01419 Encounter for gynecological examination (general) (routine) without abnormal findings: Secondary | ICD-10-CM

## 2023-04-22 NOTE — Progress Notes (Signed)
[name] is a 43 y.o. female who presents to [facility] clinic today with no complaints.    Pap Smear: Pap smear not completed today. Last Pap smear was 3-4 weeks ago at the [facility] clinic and patient is awaiting result. Have left message with the [facility] to request results. Per patient has history of an abnormal Pap smear 11/15/2021 that was ASCUS with positive HPV that a colposcopy was completed 01/31/2022 that showed CIN I. Last Pap smear result is not available in Epic.   Physical exam: Breasts Right breast greater than left breast that per patient is normal for her. No skin abnormalities bilateral breasts. No nipple retraction bilateral breasts. No nipple discharge bilateral breasts. No lymphadenopathy. No lumps palpated bilateral breasts. No complaints of pain or tenderness on exam.       MS DIGITAL SCREENING TOMO BILATERAL Result Date: 11/16/2021 CLINICAL DATA:  Screening. EXAM: DIGITAL SCREENING BILATERAL MAMMOGRAM WITH TOMOSYNTHESIS AND CAD TECHNIQUE: Bilateral screening digital craniocaudal and mediolateral oblique mammograms were obtained. Bilateral screening digital breast tomosynthesis was performed. The images were evaluated with computer-aided detection. COMPARISON:  Previous exam(s). ACR Breast Density Category c: The breast tissue is heterogeneously dense, which may obscure small masses. FINDINGS: There are no findings suspicious for malignancy. IMPRESSION: No mammographic evidence of malignancy. A result letter of this screening mammogram will be mailed directly to the patient. RECOMMENDATION: Screening mammogram in one year. (Code:SM-B-01Y) BI-RADS CATEGORY  1: Negative. Electronically Signed   By: Frederico Hamman M.D.   On: 11/16/2021 13:26   MS DIGITAL DIAG TOMO BILAT Result Date: 11/11/2019 CLINICAL DATA:  43 year old female intermittent focal pain in the lateral left breast. Patient also states that upon physical exam  with her doctor they noted a small amount of nipple discharge from the left breast. This was tested and was negative. She did not previously note any nipple discharge, and has not noticed any since. EXAM: DIGITAL DIAGNOSTIC BILATERAL MAMMOGRAM WITH TOMO AND CAD; ULTRASOUND LEFT BREAST LIMITED COMPARISON:  Previous exam(s). ACR Breast Density Category c: The breast tissue is heterogeneously dense, which may obscure small masses. FINDINGS: A BB has been placed along the lateral aspect of the left breast indicating the tender site of concern. There are no suspicious mammographic findings deep to the marker. No suspicious calcifications, masses or areas of distortion are seen in the bilateral breasts. Mammographic images were processed with CAD. A tiny amount of clear discharge could be elicited from the left nipple with manual expression. No discharge could be elicited from the right nipple. Ultrasound targeted to the left breast at 2 o'clock, 3 cm from the nipple at the tender site, demonstrates normal fibroglandular tissue. Ultrasound of the retroareolar left breast demonstrates normal fibroglandular tissue. No masses or suspicious areas of shadowing are identified. IMPRESSION: 1. No mammographic or targeted sonographic abnormalities are identified in the lateral left breast at the tender site of concern. 2. No findings in the retroareolar left breast to account for the nipple discharge seen on physical exam. 3.  No mammographic evidence of malignancy in the bilateral breasts. RECOMMENDATION: 1. Clinical follow-up recommended for the tender area of concern in the left breast. Any further workup should be based on clinical grounds. 2. Further management of nipple discharge should be based on clinical assessment as this has only been noticed once on physical exam. The patient was advised to alert her doctor if she experiences spontaneous unilateral bloody or clear nipple discharge from a single  duct only. 3.  Screening  mammogram in one year.(Code:SM-B-01Y) I have discussed the findings and recommendations with the patient. If applicable, a reminder letter will be sent to the patient regarding the next appointment. BI-RADS CATEGORY  1: Negative. Electronically Signed   By: Frederico Hamman M.D.   On: 11/11/2019 16:26   Pelvic/Bimanual Pap is not indicated today per BCCCP guidelines.   Smoking History: Patient has never smoked.   Patient Navigation: Patient education provided. Access to services provided for patient through [place] program. Spanish interpreter Jean Mays from [facility] provided.   Colorectal Cancer Screening: Per patient has never had colonoscopy completed. FIT Test given to patient to complete. No complaints today.    Breast and Cervical Cancer Risk Assessment: Patient does not have family history of breast cancer, known genetic mutations, or radiation treatment to the chest before age 51. Patient has history of cervical dysplasia. Patient has no history of being immunocompromised or DES exposure in-utero.  Risk Scores as of Encounter on 04/22/2023     Jean Mays           5-year 0.42%   Lifetime 5.75%            Last calculated by Jean Mays, CMA on 04/22/2023 at 10:32 AM        A: BCCCP exam without pap smear No complaints.   P: Referred patient to the [facility] Center of [facility] for a screening mammogram on mobile unit. Appointment scheduled Tuesday, April 22, 2023 at 1100.   Jean Heidelberg, RN 04/22/2023 10:39 AM

## 2023-04-22 NOTE — Patient Instructions (Signed)
Explained breast self awareness with Celene Kras. Patient did not need a Pap smear today due to last Pap smear was 3-4 weeks ago per patient. Let her know that we will follow up with her with the result and if any follow up is needed. Referred patient to the [facility] of [facility] for a screening mammogram on mobile unit. Appointment scheduled Tuesday, April 22, 2023 at 1100. Patient aware of appointment and will be there. Let patient know the Breast Center will follow up with her within the next couple weeks with results of her mammogram by letter or phone. Zillie Lege verbalized understanding.  Undrea Archbold, Kathaleen Maser, RN 10:39 AM

## 2023-06-19 ENCOUNTER — Other Ambulatory Visit: Payer: Self-pay

## 2023-06-19 DIAGNOSIS — E1169 Type 2 diabetes mellitus with other specified complication: Secondary | ICD-10-CM

## 2023-06-27 ENCOUNTER — Other Ambulatory Visit: Payer: No Typology Code available for payment source

## 2023-07-01 ENCOUNTER — Ambulatory Visit: Payer: No Typology Code available for payment source | Admitting: "Endocrinology

## 2023-08-15 ENCOUNTER — Ambulatory Visit: Admitting: "Endocrinology

## 2023-10-16 ENCOUNTER — Encounter: Payer: Self-pay | Admitting: "Endocrinology

## 2023-10-16 ENCOUNTER — Ambulatory Visit (INDEPENDENT_AMBULATORY_CARE_PROVIDER_SITE_OTHER): Payer: Self-pay | Admitting: "Endocrinology

## 2023-10-16 ENCOUNTER — Other Ambulatory Visit: Payer: Self-pay

## 2023-10-16 VITALS — BP 110/70 | HR 77 | Ht 62.0 in | Wt 186.0 lb

## 2023-10-16 DIAGNOSIS — R7989 Other specified abnormal findings of blood chemistry: Secondary | ICD-10-CM

## 2023-10-16 DIAGNOSIS — E1165 Type 2 diabetes mellitus with hyperglycemia: Secondary | ICD-10-CM

## 2023-10-16 DIAGNOSIS — Z7984 Long term (current) use of oral hypoglycemic drugs: Secondary | ICD-10-CM

## 2023-10-16 DIAGNOSIS — E7800 Pure hypercholesterolemia, unspecified: Secondary | ICD-10-CM

## 2023-10-16 DIAGNOSIS — Z794 Long term (current) use of insulin: Secondary | ICD-10-CM

## 2023-10-16 LAB — POCT GLYCOSYLATED HEMOGLOBIN (HGB A1C): Hemoglobin A1C: 10.4 % — AB (ref 4.0–5.6)

## 2023-10-16 MED ORDER — TRULICITY 0.75 MG/0.5ML ~~LOC~~ SOAJ
0.7500 mg | SUBCUTANEOUS | 1 refills | Status: DC
  Filled 2023-10-16 – 2023-10-29 (×2): qty 2, 28d supply, fill #0

## 2023-10-16 MED ORDER — INSULIN GLARGINE 100 UNIT/ML SOLOSTAR PEN
30.0000 [IU] | PEN_INJECTOR | Freq: Every day | SUBCUTANEOUS | 0 refills | Status: AC
Start: 2023-10-16 — End: ?
  Filled 2023-10-16: qty 15, 50d supply, fill #0

## 2023-10-16 MED ORDER — TRULICITY 0.75 MG/0.5ML ~~LOC~~ SOAJ: 0.7500 mg | SUBCUTANEOUS | 1 refills | Status: DC

## 2023-10-16 NOTE — Progress Notes (Signed)
 Outpatient Endocrinology Note Jean Birmingham, MD  10/16/23   Jean Mays 05/19/1980 [id number]  Referring Provider: Desiree Quale, NP Primary Care Provider: Department, [facility] Reason for consultation: Subjective   Assessment & Plan  Diagnoses and all orders for this visit:  Uncontrolled type 2 diabetes mellitus with hyperglycemia (HCC) -     POCT glycosylated hemoglobin (Hb A1C) -     Lipid panel -     Microalbumin / creatinine urine ratio -     Comprehensive metabolic panel with GFR -     Discontinue: Dulaglutide  (TRULICITY ) 0.75 MG/0.5ML SOAJ; Inject 0.75 mg into the skin once a week. -     Ambulatory referral to diabetic education -     insulin  glargine (LANTUS ) 100 UNIT/ML Solostar Pen; Inject 30 Units into the skin daily. -     Dulaglutide  (TRULICITY ) 0.75 MG/0.5ML SOAJ; Inject 0.75 mg into the skin once a week.  Long term (current) use of oral hypoglycemic drugs  Long-term insulin  use (HCC)  Pure hypercholesterolemia  Abnormal thyroid blood test -     TSH -     T4, free   History of abnormal thyroid blood test Ordered baseline thyroid labs Reviewed records  All conversation achieved by live language translator  Diabetes Type II complicated by neuropathy, No results found for: GFR Hba1c goal less than 7, current Hba1c is  Lab Results  Component Value Date   HGBA1C 10.4 (A) 10/16/2023   Will recommend the following: Lantus  30 units at bedtime Metformin  2000 mg every day Glimepiride 4 mg bid  Start Trulicity  0.75mg /week - discussed S/E  Bring log with BG checked three times a day  Ordered DM education Patient has no insurance so limited in her resources, [facility] so [facility] can [facility] insulin  and Trulicity , cannot do CGM for the same reason    No known contraindications/side effects to any of above medications No history of MEN syndrome/medullary thyroid cancer/pancreatitis or pancreatic cancer  in self or family  -Last LD and Tg are as follows: No results found for: LDLCALC No results found for: TRIG -not on statin -Follow low fat diet and exercise   -Blood pressure goal <140/90 - Microalbumin/creatinine goal is < 30 -Last MA/Cr is as follows: No results found for: MICROALBUR, [id number] -on ACE/ARB lisinopril 10 mg every day  -diet changes including salt restriction -limit eating outside -counseled BP targets per standards of diabetes care -uncontrolled blood pressure can lead to retinopathy, nephropathy and cardiovascular and atherosclerotic heart disease  Reviewed and counseled on: -A1C target -Blood sugar targets -Complications of uncontrolled diabetes  -Checking blood sugar before meals and bedtime and bring log next visit -All medications with mechanism of action and side effects -Hypoglycemia management: rule of 15's, Glucagon Emergency Kit and medical alert ID -low-carb low-fat plate-method diet -At least 20 minutes of physical activity per day -Annual dilated retinal eye exam and foot exam -compliance and follow up needs -follow up as scheduled or earlier if problem gets worse  Call if blood sugar is less than 70 or consistently above 250    Take a 15 gm snack of carbohydrate at bedtime before you go to sleep if your blood sugar is less than 100.    If you are going to fast after midnight for a test or procedure, ask your physician for instructions on how to reduce/decrease your insulin  dose.    Call if blood sugar is less than 70 or consistently above 250  -  Treating a low sugar by rule of 15  (15 gms of sugar every 15 min until sugar is more than 70) If you feel your sugar is low, test your sugar to be sure If your sugar is low (less than 70), then take 15 grams of a fast acting Carbohydrate (3-4 glucose tablets or glucose gel or 4 ounces of juice or regular soda) Recheck your sugar 15 min after treating low to make sure it is more than 70 If sugar  is still less than 70, treat again with 15 grams of carbohydrate          Don't drive the hour of hypoglycemia  If unconscious/unable to eat or drink by mouth, use glucagon injection or nasal spray baqsimi and call 911. Can repeat again in 15 min if still unconscious.  Return in about 6 weeks (around 11/27/2023) for visit and 8 am labs before next visit.   I have reviewed current medications, nurse's notes, allergies, vital signs, past medical and surgical history, family medical history, and social history for this encounter. Counseled patient on symptoms, examination findings, lab findings, imaging results, treatment decisions and monitoring and prognosis. The patient understood the recommendations and agrees with the treatment plan. All questions regarding treatment plan were fully answered.  Jean Birmingham, MD  10/16/23  History of Present Illness Jean Mays is a 43 y.o. year old female who presents for evaluation of Type II diabetes mellitus.  Jimena Wieczorek was first diagnosed in 2009.   Diabetes education +  Home diabetes regimen: Lantus  30 units at bedtime Metformin  2000 mg every day Glimepiride 4 mg bid   COMPLICATIONS -  MI/Stroke -  retinopathy +  neuropathy -  nephropathy  BLOOD SUGAR DATA 60-190 per recall, checks only fasting, no meter brought   08/2022 HDL 39 Tg 139 LDL 102 Chol 165 TSH 1.25 FT4 1.6  Physical Exam  BP 110/70   Pulse 77   Ht 5' 2 (1.575 m)   Wt 186 lb (84.4 kg)   SpO2 99%   BMI 34.02 kg/m    Constitutional: well developed, well nourished Head: normocephalic, atraumatic Eyes: sclera anicteric, no redness Neck: supple Lungs: normal respiratory effort Neurology: alert and oriented Skin: dry, no appreciable rashes Musculoskeletal: no appreciable defects Psychiatric: normal mood and affect Diabetic Foot Exam - Simple   No data filed      Current Medications Patient's Medications  New Prescriptions   DULAGLUTIDE  (TRULICITY )  0.75 MG/0.5ML SOAJ    Inject 0.75 mg into the skin once a week.   INSULIN  GLARGINE (LANTUS ) 100 UNIT/ML SOLOSTAR PEN    Inject 30 Units into the skin daily.  Previous Medications   IBUPROFEN  (ADVIL ) 800 MG TABLET    Take 1 tablet (800 mg total) by mouth 3 (three) times daily.   INSULIN  GLARGINE-YFGN (SEMGLEE ) 100 UNIT/ML PEN    Inject 10 Units into the skin at bedtime.   LISINOPRIL (ZESTRIL) 10 MG TABLET    Take 10 mg by mouth daily.   METFORMIN  (GLUCOPHAGE ) 1000 MG TABLET    Take 1 tablet (1,000 mg total) by mouth 2 (two) times daily with a meal.  Modified Medications   No medications on file  Discontinued Medications   No medications on file    Allergies No Known Allergies  Past Medical History Past Medical History:  Diagnosis Date   Anemia    Gestational diabetes     Past Surgical History Past Surgical History:  Procedure Laterality Date   NO  PAST SURGERIES      Family History family history includes Diabetes in her mother and sister.  Social History Social History   Socioeconomic History   Marital status: Married    Spouse name: Not on file   Number of children: 4   Years of education: Not on file   Highest education level: 8th grade  Occupational History   Not on file  Tobacco Use   Smoking status: Never   Smokeless tobacco: Never  Vaping Use   Vaping status: Never Used  Substance and Sexual Activity   Alcohol use: No   Drug use: No   Sexual activity: Yes    Birth control/protection: Injection  Other Topics Concern   Not on file  Social History Narrative   Pt was born in [place]   Social Drivers of Health   Financial Resource Strain: Not on file  Food Insecurity: No Food Insecurity (04/22/2023)   Hunger Vital Sign    Worried About Running Out of Food in the Last Year: Never true    Ran Out of Food in the Last Year: Never true  Transportation Needs: No Transportation Needs (04/22/2023)   PRAPARE - Administrator, Civil Service  (Medical): No    Lack of Transportation (Non-Medical): No  Physical Activity: Not on file  Stress: Not on file  Social Connections: Not on file  Intimate Partner Violence: Not on file    Lab Results  Component Value Date   HGBA1C 10.4 (A) 10/16/2023   HGBA1C 8.1 (H) 04/03/2016   HGBA1C 7.1 03/09/2015   No results found for: CHOL No results found for: HDL No results found for: LDLCALC No results found for: TRIG No results found for: [facility] Lab Results  Component Value Date   CREATININE 0.80 12/31/2022   No results found for: GFR No results found for: MACKEY CURRENT    Component Value Date/Time   NA 137 12/31/2022 2151   NA 137 04/03/2016 1248   K 3.6 12/31/2022 2151   CL 102 12/31/2022 2151   CO2 24 12/31/2022 2151   GLUCOSE 185 (H) 12/31/2022 2151   BUN 10 12/31/2022 2151   BUN 7 04/03/2016 1248   CREATININE 0.80 12/31/2022 2151   CREATININE 0.64 03/09/2015 1248   CALCIUM 9.5 12/31/2022 2151   PROT 7.6 04/03/2016 1248   ALBUMIN 4.4 04/03/2016 1248   AST 11 04/03/2016 1248   ALT 14 04/03/2016 1248   ALKPHOS 80 04/03/2016 1248   BILITOT 0.2 04/03/2016 1248   GFRNONAA >60 12/31/2022 2151   GFRNONAA >89 03/09/2015 1248   GFRAA 131 04/03/2016 1248   GFRAA >89 03/09/2015 1248      Latest Ref Rng & Units 12/31/2022    9:51 PM 07/20/2020    8:54 PM 04/03/2016   12:48 PM  BMP  Glucose 70 - 99 mg/dL 814  848  706   BUN 6 - 20 mg/dL 10  7  7    Creatinine 0.44 - 1.00 mg/dL 9.19  9.30  9.34   BUN/Creat Ratio 9 - 23   11   Sodium 135 - 145 mmol/L 137  136  137   Potassium 3.5 - 5.1 mmol/L 3.6  3.7  4.4   Chloride 98 - 111 mmol/L 102  103  97   CO2 22 - 32 mmol/L 24  24  24    Calcium 8.9 - 10.3 mg/dL 9.5  9.3  9.3        Component Value Date/Time  WBC 9.1 01/01/2023 0521   RBC 3.82 (L) 01/01/2023 0521   HGB 11.0 (L) 01/01/2023 0521   HGB 11.7 04/03/2016 1248   HGB 12.1 10/07/2013 0000   HGB 12.1 10/07/2013 0000   HCT 33.6 (L) 01/01/2023  0521   HCT 36.6 04/03/2016 1248   HCT 37 10/07/2013 0000   HCT 37 10/07/2013 0000   PLT 251 01/01/2023 0521   PLT 273 04/03/2016 1248   PLT 275 10/07/2013 0000   PLT 275 10/07/2013 0000   MCV 88.0 01/01/2023 0521   MCV 88 04/03/2016 1248   MCH 28.8 01/01/2023 0521   MCHC 32.7 01/01/2023 0521   RDW 14.5 01/01/2023 0521   RDW 14.8 04/03/2016 1248   LYMPHSABS 2.9 01/01/2023 0521   LYMPHSABS 2.8 04/03/2016 1248   MONOABS 0.6 01/01/2023 0521   EOSABS 0.1 01/01/2023 0521   EOSABS 0.2 04/03/2016 1248   BASOSABS 0.0 01/01/2023 0521   BASOSABS 0.0 04/03/2016 1248     Parts of this note may have been dictated using voice recognition software. There may be variances in spelling and vocabulary which are unintentional. Not all errors are proofread. Please notify the dino if any discrepancies are noted or if the meaning of any statement is not clear.

## 2023-10-17 LAB — COMPREHENSIVE METABOLIC PANEL WITH GFR
AG Ratio: 1.8 (calc) (ref 1.0–2.5)
ALT: 16 U/L (ref 6–29)
AST: 13 U/L (ref 10–35)
Albumin: 4.5 g/dL (ref 3.6–5.1)
Alkaline phosphatase (APISO): 78 U/L (ref 31–125)
BUN: 9 mg/dL (ref 7–25)
CO2: 24 mmol/L (ref 20–32)
Calcium: 9.3 mg/dL (ref 8.6–10.2)
Chloride: 105 mmol/L (ref 98–110)
Creat: 0.69 mg/dL (ref 0.50–0.99)
Globulin: 2.5 g/dL (ref 1.9–3.7)
Glucose, Bld: 202 mg/dL — ABNORMAL HIGH (ref 65–139)
Potassium: 3.7 mmol/L (ref 3.5–5.3)
Sodium: 137 mmol/L (ref 135–146)
Total Bilirubin: 0.4 mg/dL (ref 0.2–1.2)
Total Protein: 7 g/dL (ref 6.1–8.1)
eGFR: 109 mL/min/1.73m2 (ref >=60)

## 2023-10-17 LAB — MICROALBUMIN / CREATININE URINE RATIO
Creatinine, Urine: 167 mg/dL (ref 20–275)
Microalb Creat Ratio: 8 mg/g{creat} (ref <30)
Microalb, Ur: 1.3 mg/dL

## 2023-10-23 ENCOUNTER — Ambulatory Visit: Admitting: "Endocrinology

## 2023-10-27 ENCOUNTER — Other Ambulatory Visit: Payer: Self-pay

## 2023-10-29 ENCOUNTER — Other Ambulatory Visit: Payer: Self-pay

## 2023-11-21 ENCOUNTER — Other Ambulatory Visit

## 2023-11-27 ENCOUNTER — Other Ambulatory Visit: Payer: Self-pay

## 2023-11-27 ENCOUNTER — Ambulatory Visit (INDEPENDENT_AMBULATORY_CARE_PROVIDER_SITE_OTHER): Payer: Self-pay | Admitting: "Endocrinology

## 2023-11-27 ENCOUNTER — Encounter: Payer: Self-pay | Admitting: "Endocrinology

## 2023-11-27 ENCOUNTER — Ambulatory Visit: Admitting: Dietician

## 2023-11-27 VITALS — BP 120/80 | HR 81 | Ht 62.0 in | Wt 187.0 lb

## 2023-11-27 DIAGNOSIS — R7989 Other specified abnormal findings of blood chemistry: Secondary | ICD-10-CM

## 2023-11-27 DIAGNOSIS — Z7984 Long term (current) use of oral hypoglycemic drugs: Secondary | ICD-10-CM

## 2023-11-27 DIAGNOSIS — Z794 Long term (current) use of insulin: Secondary | ICD-10-CM

## 2023-11-27 DIAGNOSIS — E1165 Type 2 diabetes mellitus with hyperglycemia: Secondary | ICD-10-CM

## 2023-11-27 MED ORDER — TRULICITY 1.5 MG/0.5ML ~~LOC~~ SOAJ
1.5000 mg | SUBCUTANEOUS | 2 refills | Status: DC
  Filled 2023-11-27: qty 2, 28d supply, fill #0

## 2023-11-27 MED ORDER — TRULICITY 1.5 MG/0.5ML ~~LOC~~ SOAJ
1.5000 mg | SUBCUTANEOUS | 0 refills | Status: AC
  Filled 2023-11-27: qty 2, 28d supply, fill #0

## 2023-11-27 NOTE — Progress Notes (Signed)
 Outpatient Endocrinology Note Jean Birmingham, MD  11/27/23   Jean Mays 04-Oct-1980 [id number]  Referring Provider: Department, Jean Mays* Primary Care Provider: Department, Jean Mays Reason for consultation: Subjective   Assessment & Plan  Diagnoses and all orders for this visit:  Uncontrolled type 2 diabetes mellitus with hyperglycemia (HCC) -     Discontinue: Dulaglutide  (TRULICITY ) 1.5 MG/0.5ML SOAJ; Inject 1.5 mg into the skin once a week. -     Lipid panel -     Dulaglutide  (TRULICITY ) 1.5 MG/0.5ML SOAJ; Inject 1.5 mg into the skin once a week.  Long term (current) use of oral hypoglycemic drugs  Long-term insulin  use (HCC)   History of abnormal thyroid blood test Ordered baseline thyroid labs Reviewed records  All conversation achieved by live language translator  Diabetes Type II complicated by neuropathy, No results found for: GFR Hba1c goal less than 7, current Hba1c is  Lab Results  Component Value Date   HGBA1C 10.4 (A) 10/16/2023   Will recommend the following: Lantus  30 units at bedtime Metformin  2000 mg every day Glimepiride 4 mg bid  Start Trulicity  1.5 mg/week Bring log with BG checked three times a day  Ordered DM education Patient has no insurance so limited in her resources, [facility] so [facility] can [facility]  and Trulicity , cannot do CGM for the same reason    No known contraindications/side effects to any of above medications No history of MEN syndrome/medullary thyroid cancer/pancreatitis or pancreatic cancer in self or family  -Last LD and Tg are as follows: No results found for: LDLCALC No results found for: TRIG -not on statin -Follow low fat diet and exercise   -Blood pressure goal <140/90 - Microalbumin/creatinine goal is < 30 -Last MA/Cr is as follows: Lab Results  Component Value Date   MICROALBUR 1.3 10/16/2023   -not on ACE/ARB stopped lisinopril 10 mg every day   -diet changes including salt restriction -limit eating outside -counseled BP targets per standards of diabetes care -uncontrolled blood pressure can lead to retinopathy, nephropathy and cardiovascular and atherosclerotic heart disease  Reviewed and counseled on: -A1C target -Blood sugar targets -Complications of uncontrolled diabetes  -Checking blood sugar before meals and bedtime and bring log next visit -All medications with mechanism of action and side effects -Hypoglycemia management: rule of 15's, Glucagon Emergency Kit and medical alert ID -low-carb low-fat plate-method diet -At least 20 minutes of physical activity per day -Annual dilated retinal eye exam and foot exam -compliance and follow up needs -follow up as scheduled or earlier if problem gets worse  Call if blood sugar is less than 70 or consistently above 250    Take a 15 gm snack of carbohydrate at bedtime before you go to sleep if your blood sugar is less than 100.    If you are going to fast after midnight for a test or procedure, ask your physician for instructions on how to reduce/decrease your insulin  dose.    Call if blood sugar is less than 70 or consistently above 250  -Treating a low sugar by rule of 15  (15 gms of sugar every 15 min until sugar is more than 70) If you feel your sugar is low, test your sugar to be sure If your sugar is low (less than 70), then take 15 grams of a fast acting Carbohydrate (3-4 glucose tablets or glucose gel or 4 ounces of juice or regular soda) Recheck your sugar 15 min after treating  low to make sure it is more than 70 If sugar is still less than 70, treat again with 15 grams of carbohydrate          Don't drive the hour of hypoglycemia  If unconscious/unable to eat or drink by mouth, use glucagon injection or nasal spray baqsimi and call 911. Can repeat again in 15 min if still unconscious.  Return in about 9 weeks (around 01/29/2024) for visit, labs today.   I have  reviewed current medications, nurse's notes, allergies, vital signs, past medical and surgical history, family medical history, and social history for this encounter. Counseled patient on symptoms, examination findings, lab findings, imaging results, treatment decisions and monitoring and prognosis. The patient understood the recommendations and agrees with the treatment plan. All questions regarding treatment plan were fully answered.  Jean Birmingham, MD  11/27/23  History of Present Illness Jean Mays is a 43 y.o. year old female who presents for evaluation of Type II diabetes mellitus.  [name] was first diagnosed in 2009.   Diabetes education +  Home diabetes regimen: Lantus  30 units at bedtime Metformin  2000 mg every day Glimepiride 4 mg bid  Trulicity  0.75mg /week - no S/E   COMPLICATIONS -  MI/Stroke -  retinopathy +  neuropathy -  nephropathy  BLOOD SUGAR DATA 160-220 per recall, checks only fasting no meter brought   08/2022 HDL 39 Tg 139 LDL 102 Chol 165 TSH 1.25 FT4 1.6  Physical Exam  BP 120/80   Pulse 81   Ht 5' 2 (1.575 m)   Wt 187 lb (84.8 kg)   SpO2 98%   BMI 34.20 kg/m    Constitutional: well developed, well nourished Head: normocephalic, atraumatic Eyes: sclera anicteric, no redness Neck: supple Lungs: normal respiratory effort Neurology: alert and oriented Skin: dry, no appreciable rashes Musculoskeletal: no appreciable defects Psychiatric: normal mood and affect Diabetic Foot Exam - Simple   No data filed      Current Medications Patient's Medications  New Prescriptions   DULAGLUTIDE  (TRULICITY ) 1.5 MG/0.5ML SOAJ    Inject 1.5 mg into the skin once a week.  Previous Medications   IBUPROFEN  (ADVIL ) 800 MG TABLET    Take 1 tablet (800 mg total) by mouth 3 (three) times daily.   INSULIN  GLARGINE (LANTUS ) 100 UNIT/ML SOLOSTAR PEN    Inject 30 Units into the skin daily.   INSULIN  GLARGINE-YFGN (SEMGLEE ) 100 UNIT/ML PEN    Inject  10 Units into the skin at bedtime.   LISINOPRIL (ZESTRIL) 10 MG TABLET    Take 10 mg by mouth daily.   METFORMIN  (GLUCOPHAGE ) 1000 MG TABLET    Take 1 tablet (1,000 mg total) by mouth 2 (two) times daily with a meal.  Modified Medications   No medications on file  Discontinued Medications   DULAGLUTIDE  (TRULICITY ) 0.75 MG/0.5ML SOAJ    Inject 0.75 mg into the skin once a week.    Allergies No Known Allergies  Past Medical History Past Medical History:  Diagnosis Date   Anemia    Gestational diabetes     Past Surgical History Past Surgical History:  Procedure Laterality Date   NO PAST SURGERIES      Family History family history includes Diabetes in her mother and sister.  Social History Social History   Socioeconomic History   Marital status: Married    Spouse name: Not on file   Number of children: 4   Years of education: Not on file   Highest education level:  8th grade  Occupational History   Not on file  Tobacco Use   Smoking status: Never   Smokeless tobacco: Never  Vaping Use   Vaping status: Never Used  Substance and Sexual Activity   Alcohol use: No   Drug use: No   Sexual activity: Yes    Birth control/protection: Injection  Other Topics Concern   Not on file  Social History Narrative   Pt was born in [place]   Social Drivers of Health   Financial Resource Strain: Not on file  Food Insecurity: No Food Insecurity (04/22/2023)   Hunger Vital Sign    Worried About Running Out of Food in the Last Year: Never true    Ran Out of Food in the Last Year: Never true  Transportation Needs: No Transportation Needs (04/22/2023)   PRAPARE - Administrator, Civil Service (Medical): No    Lack of Transportation (Non-Medical): No  Physical Activity: Not on file  Stress: Not on file  Social Connections: Not on file  Intimate Partner Violence: Not on file    Lab Results  Component Value Date   HGBA1C 10.4 (A) 10/16/2023   HGBA1C 8.1 (H)  04/03/2016   HGBA1C 7.1 03/09/2015   No results found for: CHOL No results found for: HDL No results found for: LDLCALC No results found for: TRIG No results found for: [facility] Lab Results  Component Value Date   CREATININE 0.69 10/16/2023   No results found for: GFR Lab Results  Component Value Date   MICROALBUR 1.3 10/16/2023      Component Value Date/Time   NA 137 10/16/2023 1030   NA 137 04/03/2016 1248   K 3.7 10/16/2023 1030   CL 105 10/16/2023 1030   CO2 24 10/16/2023 1030   GLUCOSE 202 (H) 10/16/2023 1030   BUN 9 10/16/2023 1030   BUN 7 04/03/2016 1248   CREATININE 0.69 10/16/2023 1030   CALCIUM 9.3 10/16/2023 1030   PROT 7.0 10/16/2023 1030   PROT 7.6 04/03/2016 1248   ALBUMIN 4.4 04/03/2016 1248   AST 13 10/16/2023 1030   ALT 16 10/16/2023 1030   ALKPHOS 80 04/03/2016 1248   BILITOT 0.4 10/16/2023 1030   BILITOT 0.2 04/03/2016 1248   GFRNONAA >60 12/31/2022 2151   GFRNONAA >89 03/09/2015 1248   GFRAA 131 04/03/2016 1248   GFRAA >89 03/09/2015 1248      Latest Ref Rng & Units 10/16/2023   10:30 AM 12/31/2022    9:51 PM 07/20/2020    8:54 PM  BMP  Glucose 65 - 139 mg/dL 797  814  848   BUN 7 - 25 mg/dL 9  10  7    Creatinine 0.50 - 0.99 mg/dL 9.30  9.19  9.30   BUN/Creat Ratio 6 - 22 (calc) SEE NOTE:     Sodium 135 - 146 mmol/L 137  137  136   Potassium 3.5 - 5.3 mmol/L 3.7  3.6  3.7   Chloride 98 - 110 mmol/L 105  102  103   CO2 20 - 32 mmol/L 24  24  24    Calcium 8.6 - 10.2 mg/dL 9.3  9.5  9.3        Component Value Date/Time   WBC 9.1 01/01/2023 0521   RBC 3.82 (L) 01/01/2023 0521   HGB 11.0 (L) 01/01/2023 0521   HGB 11.7 04/03/2016 1248   HGB 12.1 10/07/2013 0000   HGB 12.1 10/07/2013 0000   HCT 33.6 (L) 01/01/2023 9478  HCT 36.6 04/03/2016 1248   HCT 37 10/07/2013 0000   HCT 37 10/07/2013 0000   PLT 251 01/01/2023 0521   PLT 273 04/03/2016 1248   PLT 275 10/07/2013 0000   PLT 275 10/07/2013 0000   MCV 88.0 01/01/2023 0521    MCV 88 04/03/2016 1248   MCH 28.8 01/01/2023 0521   MCHC 32.7 01/01/2023 0521   RDW 14.5 01/01/2023 0521   RDW 14.8 04/03/2016 1248   LYMPHSABS 2.9 01/01/2023 0521   LYMPHSABS 2.8 04/03/2016 1248   MONOABS 0.6 01/01/2023 0521   EOSABS 0.1 01/01/2023 0521   EOSABS 0.2 04/03/2016 1248   BASOSABS 0.0 01/01/2023 0521   BASOSABS 0.0 04/03/2016 1248     Parts of this note may have been dictated using voice recognition software. There may be variances in spelling and vocabulary which are unintentional. Not all errors are proofread. Please notify the dino if any discrepancies are noted or if the meaning of any statement is not clear.

## 2023-11-27 NOTE — Patient Instructions (Signed)

## 2024-04-27 ENCOUNTER — Ambulatory Visit: Payer: Self-pay | Admitting: "Endocrinology

## 2024-04-30 ENCOUNTER — Telehealth: Payer: Self-pay | Admitting: "Endocrinology

## 2024-05-20 ENCOUNTER — Ambulatory Visit: Payer: Self-pay | Admitting: "Endocrinology
